# Patient Record
Sex: Male | Born: 1955 | Race: Asian | Hispanic: No | Marital: Married | Smoking: Never smoker
Health system: Southern US, Community
[De-identification: ages and names within clinical notes are randomized; demographics above are authoritative.]

## PROBLEM LIST (undated history)

## (undated) DIAGNOSIS — M7500 Adhesive capsulitis of unspecified shoulder: Secondary | ICD-10-CM

## (undated) DIAGNOSIS — I10 Essential (primary) hypertension: Secondary | ICD-10-CM

## (undated) HISTORY — DX: Adhesive capsulitis of unspecified shoulder: M75.00

## (undated) HISTORY — PX: UPPER GI ENDOSCOPY: SHX6162

## (undated) HISTORY — PX: CHOLECYSTECTOMY: SHX55

---

## 1986-04-04 HISTORY — PX: REPAIR OF PERFORATED ULCER: SHX6065

## 2007-04-29 ENCOUNTER — Emergency Department (HOSPITAL_COMMUNITY): Admission: EM | Admit: 2007-04-29 | Discharge: 2007-04-29 | Payer: Self-pay | Admitting: Family Medicine

## 2007-04-29 LAB — CONVERTED CEMR LAB
HCT: 41.2 %
Hemoglobin: 14.1 g/dL
MCHC: 34.2 g/dL
Platelets: 201 10*3/uL
RBC: 4.73 M/uL

## 2007-05-04 ENCOUNTER — Ambulatory Visit: Payer: Self-pay | Admitting: Nurse Practitioner

## 2007-05-04 DIAGNOSIS — R109 Unspecified abdominal pain: Secondary | ICD-10-CM | POA: Insufficient documentation

## 2007-05-04 DIAGNOSIS — K219 Gastro-esophageal reflux disease without esophagitis: Secondary | ICD-10-CM | POA: Insufficient documentation

## 2007-05-04 DIAGNOSIS — M545 Low back pain, unspecified: Secondary | ICD-10-CM | POA: Insufficient documentation

## 2007-05-04 DIAGNOSIS — E119 Type 2 diabetes mellitus without complications: Secondary | ICD-10-CM | POA: Insufficient documentation

## 2007-05-04 LAB — CONVERTED CEMR LAB
ALT: 45 units/L (ref 0–53)
AST: 40 units/L — ABNORMAL HIGH (ref 0–37)
Albumin: 4.3 g/dL (ref 3.5–5.2)
Basophils Absolute: 0 10*3/uL (ref 0.0–0.1)
Basophils Relative: 0 % (ref 0–1)
Blood Glucose, Fingerstick: 116
CO2: 23 meq/L (ref 19–32)
Calcium: 9 mg/dL (ref 8.4–10.5)
Glucose, Urine, Semiquant: 500
HCT: 41.9 % (ref 39.0–52.0)
Hgb A1c MFr Bld: 6.6 %
Ketones, urine, test strip: NEGATIVE
Monocytes Absolute: 0.8 10*3/uL (ref 0.1–1.0)
Neutrophils Relative %: 44 % (ref 43–77)
Platelets: 215 10*3/uL (ref 150–400)
Potassium: 3.8 meq/L (ref 3.5–5.3)
Protein, U semiquant: NEGATIVE
RBC: 4.91 M/uL (ref 4.22–5.81)
Sodium: 139 meq/L (ref 135–145)
Specific Gravity, Urine: 1.015
TSH: 1.633 microintl units/mL (ref 0.350–5.50)
Urobilinogen, UA: 0.2
WBC: 8.3 10*3/uL (ref 4.0–10.5)
pH: 7

## 2007-05-25 ENCOUNTER — Ambulatory Visit: Payer: Self-pay | Admitting: Nurse Practitioner

## 2007-05-25 DIAGNOSIS — K5900 Constipation, unspecified: Secondary | ICD-10-CM | POA: Insufficient documentation

## 2007-05-25 LAB — CONVERTED CEMR LAB
Blood Glucose, Fingerstick: 143
Ketones, urine, test strip: NEGATIVE
Protein, U semiquant: NEGATIVE
Urobilinogen, UA: 0.2
WBC Urine, dipstick: NEGATIVE
pH: 6.5

## 2007-05-29 ENCOUNTER — Ambulatory Visit: Payer: Self-pay | Admitting: Nurse Practitioner

## 2007-06-12 ENCOUNTER — Encounter (INDEPENDENT_AMBULATORY_CARE_PROVIDER_SITE_OTHER): Payer: Self-pay | Admitting: Nurse Practitioner

## 2007-06-12 ENCOUNTER — Ambulatory Visit: Payer: Self-pay | Admitting: Internal Medicine

## 2007-06-22 ENCOUNTER — Ambulatory Visit: Payer: Self-pay | Admitting: Nurse Practitioner

## 2007-06-22 DIAGNOSIS — L909 Atrophic disorder of skin, unspecified: Secondary | ICD-10-CM | POA: Insufficient documentation

## 2007-06-22 DIAGNOSIS — L919 Hypertrophic disorder of the skin, unspecified: Secondary | ICD-10-CM

## 2007-06-25 ENCOUNTER — Ambulatory Visit (HOSPITAL_COMMUNITY): Admission: RE | Admit: 2007-06-25 | Discharge: 2007-06-25 | Payer: Self-pay | Admitting: Nurse Practitioner

## 2007-06-25 DIAGNOSIS — E7800 Pure hypercholesterolemia, unspecified: Secondary | ICD-10-CM | POA: Insufficient documentation

## 2007-06-25 LAB — CONVERTED CEMR LAB
Cholesterol: 216 mg/dL — ABNORMAL HIGH (ref 0–200)
HDL: 47 mg/dL (ref >39)
LDL Cholesterol: 131 mg/dL — ABNORMAL HIGH (ref 0–99)
Total CHOL/HDL Ratio: 4.6
Triglycerides: 191 mg/dL — ABNORMAL HIGH (ref <150)

## 2007-07-24 ENCOUNTER — Ambulatory Visit: Payer: Self-pay | Admitting: Nurse Practitioner

## 2007-07-24 LAB — CONVERTED CEMR LAB
Bilirubin Urine: NEGATIVE
Blood Glucose, Fingerstick: 162
Blood in Urine, dipstick: NEGATIVE
Microalb, Ur: 0.33 mg/dL (ref 0.00–1.89)
Protein, U semiquant: NEGATIVE
Specific Gravity, Urine: <1.005
pH: 6.5

## 2007-07-25 ENCOUNTER — Encounter (INDEPENDENT_AMBULATORY_CARE_PROVIDER_SITE_OTHER): Payer: Self-pay | Admitting: Nurse Practitioner

## 2007-08-14 ENCOUNTER — Ambulatory Visit: Payer: Self-pay | Admitting: Nurse Practitioner

## 2007-08-24 ENCOUNTER — Encounter (INDEPENDENT_AMBULATORY_CARE_PROVIDER_SITE_OTHER): Payer: Self-pay | Admitting: Nurse Practitioner

## 2007-09-10 ENCOUNTER — Encounter (INDEPENDENT_AMBULATORY_CARE_PROVIDER_SITE_OTHER): Payer: Self-pay | Admitting: Nurse Practitioner

## 2007-09-11 ENCOUNTER — Ambulatory Visit: Payer: Self-pay | Admitting: Nurse Practitioner

## 2007-09-14 ENCOUNTER — Ambulatory Visit: Payer: Self-pay | Admitting: Nurse Practitioner

## 2007-09-14 LAB — CONVERTED CEMR LAB: Hgb A1c MFr Bld: 5.9 %

## 2007-09-18 ENCOUNTER — Encounter (INDEPENDENT_AMBULATORY_CARE_PROVIDER_SITE_OTHER): Payer: Self-pay | Admitting: Nurse Practitioner

## 2007-11-02 ENCOUNTER — Ambulatory Visit: Payer: Self-pay | Admitting: Nurse Practitioner

## 2007-11-02 DIAGNOSIS — I10 Essential (primary) hypertension: Secondary | ICD-10-CM | POA: Insufficient documentation

## 2007-12-07 ENCOUNTER — Ambulatory Visit: Payer: Self-pay | Admitting: Nurse Practitioner

## 2007-12-11 LAB — CONVERTED CEMR LAB
CO2: 27 meq/L (ref 19–32)
Calcium: 9.7 mg/dL (ref 8.4–10.5)
Chloride: 103 meq/L (ref 96–112)
Cholesterol: 191 mg/dL (ref 0–200)
HDL: 41 mg/dL (ref >39)
Helicobacter Pylori Antibody-IgG: <0.4
Sodium: 140 meq/L (ref 135–145)
Total Bilirubin: 0.5 mg/dL (ref 0.3–1.2)
Total CHOL/HDL Ratio: 4.7

## 2007-12-27 ENCOUNTER — Ambulatory Visit: Payer: Self-pay | Admitting: Nurse Practitioner

## 2007-12-27 LAB — CONVERTED CEMR LAB: Blood Glucose, Fingerstick: 108

## 2008-01-09 ENCOUNTER — Ambulatory Visit: Payer: Self-pay | Admitting: Nurse Practitioner

## 2008-01-25 ENCOUNTER — Ambulatory Visit: Payer: Self-pay | Admitting: Gastroenterology

## 2008-01-25 DIAGNOSIS — K3189 Other diseases of stomach and duodenum: Secondary | ICD-10-CM | POA: Insufficient documentation

## 2008-01-25 DIAGNOSIS — R1013 Epigastric pain: Secondary | ICD-10-CM

## 2008-02-08 ENCOUNTER — Emergency Department (HOSPITAL_COMMUNITY): Admission: EM | Admit: 2008-02-08 | Discharge: 2008-02-08 | Payer: Self-pay | Admitting: Emergency Medicine

## 2008-02-20 ENCOUNTER — Ambulatory Visit: Payer: Self-pay | Admitting: Gastroenterology

## 2008-02-26 ENCOUNTER — Ambulatory Visit: Payer: Self-pay | Admitting: Nurse Practitioner

## 2008-02-26 DIAGNOSIS — H1045 Other chronic allergic conjunctivitis: Secondary | ICD-10-CM | POA: Insufficient documentation

## 2008-02-26 LAB — CONVERTED CEMR LAB: LDL Goal: 100 mg/dL

## 2008-02-27 ENCOUNTER — Encounter: Payer: Self-pay | Admitting: Gastroenterology

## 2008-02-27 ENCOUNTER — Ambulatory Visit: Payer: Self-pay | Admitting: Gastroenterology

## 2008-03-03 ENCOUNTER — Encounter: Payer: Self-pay | Admitting: Gastroenterology

## 2008-03-31 ENCOUNTER — Telehealth (INDEPENDENT_AMBULATORY_CARE_PROVIDER_SITE_OTHER): Payer: Self-pay | Admitting: *Deleted

## 2008-04-18 ENCOUNTER — Ambulatory Visit: Payer: Self-pay | Admitting: Gastroenterology

## 2008-05-21 ENCOUNTER — Ambulatory Visit: Payer: Self-pay | Admitting: Nurse Practitioner

## 2008-05-21 LAB — CONVERTED CEMR LAB
Blood Glucose, Fingerstick: 119
Hgb A1c MFr Bld: 5.6 %

## 2008-06-17 ENCOUNTER — Ambulatory Visit: Payer: Self-pay | Admitting: Gastroenterology

## 2008-09-12 ENCOUNTER — Ambulatory Visit: Payer: Self-pay | Admitting: Nurse Practitioner

## 2008-09-12 DIAGNOSIS — L259 Unspecified contact dermatitis, unspecified cause: Secondary | ICD-10-CM | POA: Insufficient documentation

## 2008-09-12 LAB — CONVERTED CEMR LAB
Bilirubin Urine: NEGATIVE
Hgb A1c MFr Bld: 5.6 %
Protein, U semiquant: NEGATIVE
Specific Gravity, Urine: 1.02
Urobilinogen, UA: 0.2
pH: 6.5

## 2008-09-15 LAB — CONVERTED CEMR LAB
ALT: 25 units/L (ref 0–53)
AST: 24 units/L (ref 0–37)
Albumin: 4.4 g/dL (ref 3.5–5.2)
Alkaline Phosphatase: 46 units/L (ref 39–117)
BUN: 15 mg/dL (ref 6–23)
CO2: 25 meq/L (ref 19–32)
Calcium: 9.1 mg/dL (ref 8.4–10.5)
Chloride: 105 meq/L (ref 96–112)
Creatinine, Ser: 1.02 mg/dL (ref 0.40–1.50)
LDL Cholesterol: 82 mg/dL (ref 0–99)
Potassium: 4.4 meq/L (ref 3.5–5.3)
Total Protein: 7.8 g/dL (ref 6.0–8.3)

## 2008-10-10 ENCOUNTER — Ambulatory Visit: Payer: Self-pay | Admitting: Nurse Practitioner

## 2008-10-10 DIAGNOSIS — M722 Plantar fascial fibromatosis: Secondary | ICD-10-CM | POA: Insufficient documentation

## 2008-10-16 ENCOUNTER — Encounter (INDEPENDENT_AMBULATORY_CARE_PROVIDER_SITE_OTHER): Payer: Self-pay | Admitting: Nurse Practitioner

## 2008-11-04 ENCOUNTER — Ambulatory Visit: Payer: Self-pay | Admitting: Gastroenterology

## 2009-01-16 ENCOUNTER — Ambulatory Visit: Payer: Self-pay | Admitting: Nurse Practitioner

## 2009-01-16 LAB — CONVERTED CEMR LAB: Blood Glucose, Fingerstick: 85

## 2009-04-01 ENCOUNTER — Emergency Department (HOSPITAL_COMMUNITY): Admission: EM | Admit: 2009-04-01 | Discharge: 2009-04-01 | Payer: Self-pay | Admitting: Emergency Medicine

## 2009-05-11 ENCOUNTER — Ambulatory Visit: Payer: Self-pay | Admitting: Nurse Practitioner

## 2009-05-11 LAB — CONVERTED CEMR LAB
Glucose, Bld: 73 mg/dL
Hgb A1c MFr Bld: 5.7 %

## 2009-05-13 ENCOUNTER — Encounter (INDEPENDENT_AMBULATORY_CARE_PROVIDER_SITE_OTHER): Payer: Self-pay | Admitting: Nurse Practitioner

## 2009-05-13 LAB — CONVERTED CEMR LAB
Basophils Absolute: 0 10*3/uL (ref 0.0–0.1)
Basophils Relative: 0 % (ref 0–1)
CO2: 28 meq/L (ref 19–32)
Calcium: 9.9 mg/dL (ref 8.4–10.5)
Cholesterol: 205 mg/dL — ABNORMAL HIGH (ref 0–200)
HCT: 41.9 % (ref 39.0–52.0)
HDL: 39 mg/dL — ABNORMAL LOW (ref >39)
Hemoglobin: 14.3 g/dL (ref 13.0–17.0)
Lymphocytes Relative: 41 % (ref 12–46)
Monocytes Relative: 6 % (ref 3–12)
Neutro Abs: 4.1 10*3/uL (ref 1.7–7.7)
Neutrophils Relative %: 51 % (ref 43–77)
Platelets: 221 10*3/uL (ref 150–400)
RBC: 5.04 M/uL (ref 4.22–5.81)
TSH: 2.414 microintl units/mL (ref 0.350–4.500)
Total Bilirubin: 0.3 mg/dL (ref 0.3–1.2)
Total CHOL/HDL Ratio: 5.3
Total Protein: 7.9 g/dL (ref 6.0–8.3)
Triglycerides: 241 mg/dL — ABNORMAL HIGH (ref <150)
VLDL: 48 mg/dL — ABNORMAL HIGH (ref 0–40)

## 2009-06-25 ENCOUNTER — Ambulatory Visit: Payer: Self-pay | Admitting: Nurse Practitioner

## 2009-06-25 LAB — CONVERTED CEMR LAB: Blood Glucose, Fingerstick: 102

## 2009-06-26 ENCOUNTER — Encounter (INDEPENDENT_AMBULATORY_CARE_PROVIDER_SITE_OTHER): Payer: Self-pay | Admitting: Nurse Practitioner

## 2009-10-20 ENCOUNTER — Ambulatory Visit: Payer: Self-pay | Admitting: Nurse Practitioner

## 2009-10-20 LAB — CONVERTED CEMR LAB: Hgb A1c MFr Bld: 5.9 %

## 2010-01-14 ENCOUNTER — Ambulatory Visit: Payer: Self-pay | Admitting: Internal Medicine

## 2010-01-14 ENCOUNTER — Encounter (INDEPENDENT_AMBULATORY_CARE_PROVIDER_SITE_OTHER): Payer: Self-pay | Admitting: Nurse Practitioner

## 2010-01-14 LAB — CONVERTED CEMR LAB
Creatinine, Urine: 73.9 mg/dL
Glucose, Urine, Semiquant: NEGATIVE
Ketones, urine, test strip: NEGATIVE
Nitrite: NEGATIVE
Protein, U semiquant: NEGATIVE
Specific Gravity, Urine: 1.01
Urobilinogen, UA: 0.2

## 2010-02-19 ENCOUNTER — Encounter (INDEPENDENT_AMBULATORY_CARE_PROVIDER_SITE_OTHER): Payer: Self-pay | Admitting: Nurse Practitioner

## 2010-03-01 ENCOUNTER — Encounter (INDEPENDENT_AMBULATORY_CARE_PROVIDER_SITE_OTHER): Payer: Self-pay | Admitting: Nurse Practitioner

## 2010-03-26 ENCOUNTER — Encounter (INDEPENDENT_AMBULATORY_CARE_PROVIDER_SITE_OTHER): Payer: Self-pay | Admitting: Nurse Practitioner

## 2010-04-28 ENCOUNTER — Ambulatory Visit
Admission: RE | Admit: 2010-04-28 | Discharge: 2010-04-28 | Payer: Self-pay | Source: Home / Self Care | Attending: Nurse Practitioner | Admitting: Nurse Practitioner

## 2010-04-28 LAB — CONVERTED CEMR LAB: Hgb A1c MFr Bld: 5.6 %

## 2010-05-04 NOTE — Letter (Signed)
Summary: DIABETIC EYE EXAMINATION REPORT  DIABETIC EYE EXAMINATION REPORT   Imported By: Arta Bruce 03/01/2010 16:03:05  _____________________________________________________________________  External Attachment:    Type:   Image     Comment:   External Document

## 2010-05-04 NOTE — Assessment & Plan Note (Signed)
Summary: Diabetes/HTN   Vital Signs:  Patient profile:   55 year old male Weight:      134.2 pounds BMI:     25.45 BSA:     1.60 Temp:     98.5 degrees F oral Pulse rate:   76 / minute Pulse rhythm:   regular Resp:     20 per minute BP sitting:   106 / 75  (left arm) Cuff size:   regular  Vitals Entered By: Levon Hedger (June 25, 2009 11:43 AM) CC: follow-up visit DM....pt has not been taking DM medications for 6 weeks and has brought log of readings....back and stomach pain, Hypertension Management, Lipid Management, Abdominal Pain Is Patient Diabetic? Yes Pain Assessment Patient in pain? no      CBG Result 102 CBG Device ID A  Does patient need assistance? Functional Status Self care Ambulation Normal   Primary Care Provider:  Lehman Prom FNP  CC:  follow-up visit DM....pt has not been taking DM medications for 6 weeks and has brought log of readings....back and stomach pain, Hypertension Management, Lipid Management, and Abdominal Pain.  History of Present Illness:  Pt into the office for diabetes f/u. During the last visit pt was doing well with is blood sugar. Medications stopped during the last visit.  Pt reports today that he notices when his blood sugar gets above 150 that he has tingling in his body.   Daughter present to interpret today for pt  Diabetes Management History:      The patient is a 55 years old male who comes in for evaluation of Type 2 Diabetes Mellitus.  He has not been enrolled in the "Diabetic Education Program".  He states understanding of dietary principles and is following his diet appropriately.  Sensory loss is noted.  Self foot exams are being performed.  He is checking home blood sugars.  He says that he is not exercising regularly.        Other comments include: checks blood sugar every other day before breakfast.        There are no symptoms to suggest diabetic complications.  No changes have been made to his treatment plan  since last visit.        His home blood sugars include fasting blood sugars: highest: 178, lowest: 100.    Dyspepsia History:      There is a prior history of GERD.  The patient does not have a prior history of documented ulcer disease.  The dominant symptom is heartburn or acid reflux.  An H-2 blocker medication is not currently being taken.  He has no history of a positive H. Pylori serology.  A prior EGD has been done which showed moderate or severe esophagitis.    Hypertension History:      He denies headache, chest pain, and palpitations.  He notes no problems with any antihypertensive medication side effects.  pt is taking medication as ordered.        Positive major cardiovascular risk factors include male age 43 years old or older, diabetes, hyperlipidemia, and hypertension.  Negative major cardiovascular risk factors include negative family history for ischemic heart disease and non-tobacco-user status.        Further assessment for target organ damage reveals no history of ASHD, cardiac end-organ damage (CHF/LVH), stroke/TIA, peripheral vascular disease, renal insufficiency, or hypertensive retinopathy.    Lipid Management History:      Positive NCEP/ATP III risk factors include male age 51 years old or  older, diabetes, HDL cholesterol less than 40, and hypertension.  Negative NCEP/ATP III risk factors include no family history for ischemic heart disease, non-tobacco-user status, no ASHD (atherosclerotic heart disease), no prior stroke/TIA, no peripheral vascular disease, and no history of aortic aneurysm.        The patient states that he knows about the "Therapeutic Lifestyle Change" diet.  The patient does not know about adjunctive measures for cholesterol lowering.  He expresses no side effects from his lipid-lowering medication.  The patient denies any symptoms to suggest myopathy or liver disease.       Allergies: No Known Drug Allergies  Review of Systems CV:  Denies chest pain  or discomfort. Resp:  Denies cough. GI:  Complains of indigestion. Neuro:  burning in entire body when his blood sugar get high.  He has correlated that when blood sugar is slightly elevated and then he exercises then it gets better.Marland Kitchen  Physical Exam  General:  alert.   Head:  normocephalic.   Eyes:  glasses Lungs:  normal breath sounds.   Heart:  normal rate and regular rhythm.   Abdomen:  lumbar support in place Msk:  up to the exam table Neurologic:  alert & oriented X3.    Diabetes Management Exam:    Foot Exam (with socks and/or shoes not present):       Sensory-Monofilament:          Left foot: normal          Right foot: normal   Impression & Recommendations:  Problem # 1:  DIABETES MELLITUS, TYPE II (ICD-250.00) will restart glucophage 500mg   His updated medication list for this problem includes:    Glucophage Xr 500 Mg Xr24h-tab (Metformin hcl) .Marland Kitchen... 1/2 tablet by mouth daily    Lisinopril 5 Mg Tabs (Lisinopril) ..... One tablet by mouth daily for blood pressure  Orders: Capillary Blood Glucose/CBG (01027)  Problem # 2:  HYPERTENSION, BENIGN ESSENTIAL, CONTROLLED (ICD-401.1) DASH diet continue current meds His updated medication list for this problem includes:    Lisinopril 5 Mg Tabs (Lisinopril) ..... One tablet by mouth daily for blood pressure  Problem # 3:  HYPERCHOLESTEROLEMIA (ICD-272.0) will increase pravastatin to 40mg  as per last cholesterol check His updated medication list for this problem includes:    Pravastatin Sodium 40 Mg Tabs (Pravastatin sodium) ..... One tablet by mouth nightly for cholesterol **pharmacy - note change in dose**  Problem # 4:  GERD (ICD-530.81)  His updated medication list for this problem includes:    Omeprazole-sodium Bicarbonate 40-1100 Mg Caps (Omeprazole-sodium bicarbonate) ..... One tablet by mouth daily before breakfast for  Complete Medication List: 1)  Lumbar Support Cushion Misc (Misc. devices) .... Apply to  lumbar area of back daily dx - lumbago - 724.2 size to fit 2)  Prodigy Autocode Blood Glucose W/device Kit (Blood glucose monitoring suppl) .... Dispense glucometer to check blood sugar  dx 250.00 3)  Multivitamins Tabs (Multiple vitamin) .Marland Kitchen.. 1 tablet by mouth daily 4)  Glucophage Xr 500 Mg Xr24h-tab (Metformin hcl) .... 1/2 tablet by mouth daily 5)  Ultram 50 Mg Tabs (Tramadol hcl) .Marland Kitchen.. 1 tablet by mouth daily as needed for back 6)  Pravastatin Sodium 40 Mg Tabs (Pravastatin sodium) .... One tablet by mouth nightly for cholesterol **pharmacy - note change in dose** 7)  Omeprazole-sodium Bicarbonate 40-1100 Mg Caps (Omeprazole-sodium bicarbonate) .... One tablet by mouth daily before breakfast for 8)  Prodigy Twist Top Lancets 28g Misc (Lancets) .... To check blood  sugar daily before breakfast 9)  Prodigy Blood Glucose Test Strp (Glucose blood) .... To check blood sugar daily before breakfast 10)  Hydrocortisone 2.5 % Crea (Hydrocortisone) .... One application topically to neck two times a day 11)  Lisinopril 5 Mg Tabs (Lisinopril) .... One tablet by mouth daily for blood pressure 12)  Gas-x 80 Mg Chew (Simethicone) .... Take 1-2 pills with every meal  Diabetes Management Assessment/Plan:      The following lipid goals have been established for the patient: Total cholesterol goal of 200; LDL cholesterol goal of 100; HDL cholesterol goal of 40; Triglyceride goal of 150.  His blood pressure goal is < 130/80.    Dyspepsia Assessment/Plan:  Step Therapy: GERD Treatment Protocols:    Step-1: started  Hypertension Assessment/Plan:      The patient's hypertensive risk group is category C: Target organ damage and/or diabetes.  His calculated 10 year risk of coronary heart disease is 14 %.  Today's blood pressure is 106/75.  His blood pressure goal is < 130/80.  Lipid Assessment/Plan:      Based on NCEP/ATP III, the patient's risk factor category is "history of diabetes".  The patient's lipid goals  are as follows: Total cholesterol goal is 200; LDL cholesterol goal is 100; HDL cholesterol goal is 40; Triglyceride goal is 150.    Patient Instructions: 1)  Keep checking your blood sugar every other day. 2)  Restart blood sugar medications. 3)  Start new medication for your stomach. 4)  Follow up in 3 months - June 2011 or sooner if necessary Prescriptions: OMEPRAZOLE-SODIUM BICARBONATE 40-1100 MG CAPS (OMEPRAZOLE-SODIUM BICARBONATE) One tablet by mouth daily before breakfast for  #30 x 5   Entered and Authorized by:   Lehman Prom FNP   Signed by:   Lehman Prom FNP on 06/25/2009   Method used:   Print then Give to Patient   RxID:   [id number] GLUCOPHAGE XR 500 MG XR24H-TAB (METFORMIN HCL) 1/2 tablet by mouth daily  #15 x 5   Entered and Authorized by:   Lehman Prom FNP   Signed by:   Lehman Prom FNP on 06/25/2009   Method used:   Print then Give to Patient   RxID:   [id number] PRAVASTATIN SODIUM 40 MG TABS (PRAVASTATIN SODIUM) One tablet by mouth nightly for cholesterol **Pharmacy - note change in dose**  #30 x 5   Entered and Authorized by:   Lehman Prom FNP   Signed by:   Lehman Prom FNP on 06/25/2009   Method used:   Print then Give to Patient   RxID:   [id number] LISINOPRIL 5 MG TABS (LISINOPRIL) One tablet by mouth daily for blood pressure  #30 x 5   Entered and Authorized by:   Lehman Prom FNP   Signed by:   Lehman Prom FNP on 06/25/2009   Method used:   Print then Give to Patient   RxID:   [id number]   Last LDL:                                                 118 (05/11/2009 9:40:00 PM)        Diabetic Foot Exam    10-g (5.07) Semmes-Weinstein Monofilament Test Performed by: Levon Hedger          Right Foot  Left Foot Visual Inspection               Test Control      normal         normal Site 1         normal         normal Site 2         normal         normal Site 3         normal          normal Site 4         normal         normal Site 5         normal         normal Site 6         normal         normal Site 7         normal         normal Site 8         normal         normal Site 9         normal         normal Site 10         normal         normal  Impression      normal         normal

## 2010-05-04 NOTE — Miscellaneous (Signed)
Summary: Eye exam   Diabetes Management History:      The patient is a 55 years old male who comes in for evaluation of Type 2 Diabetes Mellitus.  He has not been enrolled in the "Diabetic Education Program".  He is checking home blood sugars.  He says that he is not exercising regularly.    Diabetes Management Exam:    Eye Exam:       Eye Exam done elsewhere          Date: 02/19/2010          Results: mild diabetic retinopathy          Done by: heather [place]  Diabetes Management Assessment/Plan:      The following lipid goals have been established for the patient: Total cholesterol goal of 200; LDL cholesterol goal of 100; HDL cholesterol goal of 40; Triglyceride goal of 150.  His blood pressure goal is < 130/80.   Clinical Lists Changes  Observations: Added new observation of EYES COMMENT: 03/2011 (03/01/2010 14:57) Added new observation of EYE EXAM BY: heather [place] (02/19/2010 14:58) Added new observation of DMEYEEXMRES: mild diabetic retinopathy (02/19/2010 14:58) Added new observation of DIAB EYE EX: mild diabetic retinopathy (02/19/2010 14:58)
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
## 2010-05-04 NOTE — Letter (Signed)
Summary: Lipid Letter  [facility]  [address] [place], [place] [address]   Phone: [phone]  Fax: [phone]    05/13/2009  [name] [address] [place], [place]  [address]  Dear Levi Ross:  We have carefully reviewed your last lipid profile from 05/11/2009 and the results are noted below with a summary of recommendations for lipid management.    Cholesterol:       205     Goal: less than 200   HDL "good" Cholesterol:   39     Goal: greater than 40   LDL "bad" Cholesterol:   118     Goal: less than 70   Triglycerides:       241     Goal: less than 150    Your cholesterol is still not at goal. Your medication has been increased to pravastatin 40mg  by mouth nightly.  A new prescription has been sent to your pharmacy.    Current Medications: 1)    Lumbar Support Cushion   Misc (Misc. devices) .... Apply to lumbar area of back daily dx - lumbago - 724.2 size to fit 2)    Prodigy Autocode Blood Glucose W/device Kit (Blood glucose monitoring suppl) .... Dispense glucometer to check blood sugar  dx 250.00 3)    Multivitamins  Tabs (Multiple vitamin) .Marland Kitchen.. 1 tablet by mouth daily 4)    Glucophage Xr 500 Mg Xr24h-tab (Metformin hcl) .... Hold 5)    Ultram 50 Mg Tabs (Tramadol hcl) .Marland Kitchen.. 1 tablet by mouth daily as needed for back 6)    Pravastatin Sodium 40 Mg Tabs (Pravastatin sodium) .... One tablet by mouth nightly for cholesterol **pharmacy - note change in dose** 7)    Nexium 40 Mg Cpdr (Esomeprazole magnesium) .Marland Kitchen.. 1 tablet by mouth daily for stomach pharmacy - prior approval authorized today** 8)    Prodigy Twist Top Lancets 28g  Misc (Lancets) .... To check blood sugar daily before breakfast 9)    Prodigy Blood Glucose Test  Strp (Glucose blood) .... To check blood sugar daily before breakfast 10)    Hydrocortisone 2.5 % Crea (Hydrocortisone) .... One application topically to neck two times a day 11)    Lisinopril 5 Mg Tabs (Lisinopril) .... One tablet by  mouth daily for blood pressure 12)    Gas-x 80 Mg Chew (Simethicone) .... Take 1-2 pills with every meal  If you have any questions, please call. We appreciate being able to work with you.   Sincerely,    [facility] Lehman Prom FNP

## 2010-05-04 NOTE — Assessment & Plan Note (Signed)
Summary: Diabetes   Vital Signs:  Patient profile:   55 year old male Height:      61 inches Weight:      134 pounds BMI:     25.41 Temp:     98.7 degrees F oral Pulse rate:   76 / minute Pulse rhythm:   regular Resp:     16 per minute BP sitting:   110 / 82  (left arm) Cuff size:   regular  Vitals Entered By: [place] Shannon (January 14, 2010 12:26 PM)  Nutrition Counseling: Patient's BMI is greater than 25 and therefore counseled on weight management options. CC: Hypertension Management, Lipid Management, Abdominal Pain CBG Result 71  Does patient need assistance? Functional Status Self care Ambulation Normal   Primary Care Provider:  Lehman Prom FNP  CC:  Hypertension Management, Lipid Management, and Abdominal Pain.  History of Present Illness:  Pt into the office for diabetes f/u. No acute problems at this time  Pt presents today with all his medications.  Dyspepsia History:      There is a prior history of GERD.  The patient does not have a prior history of documented ulcer disease.  The dominant symptom is heartburn or acid reflux.  An H-2 blocker medication is not currently being taken.  He has no history of a positive H. Pylori serology.  A prior EGD has been done which showed moderate or severe esophagitis.    Hypertension History:      He denies headache, chest pain, and palpitations.  He notes no problems with any antihypertensive medication side effects.        Positive major cardiovascular risk factors include male age 87 years old or older, diabetes, hyperlipidemia, and hypertension.  Negative major cardiovascular risk factors include negative family history for ischemic heart disease and non-tobacco-user status.        Further assessment for target organ damage reveals no history of ASHD, cardiac end-organ damage (CHF/LVH), stroke/TIA, peripheral vascular disease, renal insufficiency, or hypertensive retinopathy.    Lipid Management History:  Positive NCEP/ATP III risk factors include male age 47 years old or older, diabetes, HDL cholesterol less than 40, and hypertension.  Negative NCEP/ATP III risk factors include no family history for ischemic heart disease, non-tobacco-user status, no ASHD (atherosclerotic heart disease), no prior stroke/TIA, no peripheral vascular disease, and no history of aortic aneurysm.        The patient states that he knows about the "Therapeutic Lifestyle Change" diet.  The patient does not know about adjunctive measures for cholesterol lowering.  He expresses no side effects from his lipid-lowering medication.  The patient denies any symptoms to suggest myopathy or liver disease.       Habits & Providers  Alcohol-Tobacco-Diet     Alcohol drinks/day: 0     Tobacco Status: never  Exercise-Depression-Behavior     Does Patient Exercise: no     Depression Counseling: not indicated; screening negative for depression     Drug Use: no  Current Medications (verified): 1)  Lumbar Support Cushion   Misc (Misc. Devices) .... Apply To Lumbar Area of Back Daily Dx - Lumbago - 724.2 Size To Fit 2)  Prodigy Autocode Blood Glucose W/device Kit (Blood Glucose Monitoring Suppl) .... Dispense Glucometer To Check Blood Sugar  Dx 250.00 3)  Multivitamins  Tabs (Multiple Vitamin) .Marland Kitchen.. 1 Tablet By Mouth Daily 4)  Glucophage Xr 500 Mg Xr24h-Tab (Metformin Hcl) .... 1/2 Tablet By Mouth Daily 5)  Ultram 50 Mg  Tabs (Tramadol Hcl) .Marland Kitchen.. 1 Tablet By Mouth Daily As Needed For Back 6)  Pravastatin Sodium 40 Mg Tabs (Pravastatin Sodium) .... One Tablet By Mouth Nightly For Cholesterol **pharmacy - Note Change in Dose** 7)  Omeprazole-Sodium Bicarbonate 40-1100 Mg Caps (Omeprazole-Sodium Bicarbonate) .... One Tablet By Mouth Daily Before Breakfast For 8)  Prodigy Twist Top Lancets 28g  Misc (Lancets) .... To Check Blood Sugar Daily Before Breakfast 9)  Prodigy Blood Glucose Test  Strp (Glucose Blood) .... To Check Blood Sugar Daily  Before Breakfast 10)  Lisinopril 5 Mg Tabs (Lisinopril) .... One Tablet By Mouth Daily For Blood Pressure 11)  Gas-X 80 Mg Chew (Simethicone) .... Take 1-2 Pills With Every Meal 12)  Gabapentin 300 Mg Caps (Gabapentin) .... One Tablet By Mouth  Nightly For Back and Leg Pain  Allergies: No Known Drug Allergies  Review of Systems General:  Denies fever. CV:  Denies chest pain or discomfort. Resp:  Denies cough. GI:  Denies abdominal pain, nausea, and vomiting. MS:  Complains of low back pain.  Physical Exam  General:  alert.   Head:  normocephalic and male-pattern balding.   Lungs:  normal breath sounds.   Heart:  normal rate and regular rhythm.   Abdomen:  healed abdominal scar Msk:  normal ROM.   Neurologic:  alert & oriented X3.   Psych:  Oriented X3.     Impression & Recommendations:  Problem # 1:  DIABETES MELLITUS, TYPE II (ICD-250.00) Well controlled continue current medications His updated medication list for this problem includes:    Glucophage Xr 500 Mg Xr24h-tab (Metformin hcl) .Marland Kitchen... 1/2 tablet by mouth daily    Lisinopril 5 Mg Tabs (Lisinopril) ..... One tablet by mouth daily for blood pressure  Orders: Capillary Blood Glucose/CBG (29528) UA Dipstick w/o Micro (manual) (41324) T-Urine Microalbumin w/creat. ratio [phone])  Problem # 2:  HYPERTENSION, BENIGN ESSENTIAL, CONTROLLED (ICD-401.1) DASH diet continue current meds His updated medication list for this problem includes:    Lisinopril 5 Mg Tabs (Lisinopril) ..... One tablet by mouth daily for blood pressure  Orders: EKG w/ Interpretation (93000)  Problem # 3:  NEED PROPHYLACTIC VACCINATION&INOCULATION FLU (ICD-V04.81) given today in office Orders: Admin of Any Addtl Vaccine (34742)  Problem # 4:  HYPERCHOLESTEROLEMIA (ICD-272.0) stable His updated medication list for this problem includes:    Pravastatin Sodium 40 Mg Tabs (Pravastatin sodium) ..... One tablet by mouth nightly for  cholesterol **pharmacy - note change in dose**  Complete Medication List: 1)  Lumbar Support Cushion Misc (Misc. devices) .... Apply to lumbar area of back daily dx - lumbago - 724.2 size to fit 2)  Prodigy Autocode Blood Glucose W/device Kit (Blood glucose monitoring suppl) .... Dispense glucometer to check blood sugar  dx 250.00 3)  Multivitamins Tabs (Multiple vitamin) .Marland Kitchen.. 1 tablet by mouth daily 4)  Glucophage Xr 500 Mg Xr24h-tab (Metformin hcl) .... 1/2 tablet by mouth daily 5)  Ultram 50 Mg Tabs (Tramadol hcl) .Marland Kitchen.. 1 tablet by mouth daily as needed for back 6)  Pravastatin Sodium 40 Mg Tabs (Pravastatin sodium) .... One tablet by mouth nightly for cholesterol **pharmacy - note change in dose** 7)  Omeprazole-sodium Bicarbonate 40-1100 Mg Caps (Omeprazole-sodium bicarbonate) .... One tablet by mouth daily before breakfast for 8)  Prodigy Twist Top Lancets 28g Misc (Lancets) .... To check blood sugar daily before breakfast 9)  Prodigy Blood Glucose Test Strp (Glucose blood) .... To check blood sugar daily before breakfast 10)  Lisinopril 5 Mg Tabs (  Lisinopril) .... One tablet by mouth daily for blood pressure 11)  Gas-x 80 Mg Chew (Simethicone) .... Take 1-2 pills with every meal 12)  Gabapentin 300 Mg Caps (Gabapentin) .... One tablet by mouth  nightly for back and leg pain  Other Orders: Influenza Vaccine NON MCR ([id number])  Dyspepsia Assessment/Plan:  Step Therapy: GERD Treatment Protocols:    Step-1: started  Hypertension Assessment/Plan:      The patient's hypertensive risk group is category C: Target organ damage and/or diabetes.  His calculated 10 year risk of coronary heart disease is 14 %.  Today's blood pressure is 110/82.  His blood pressure goal is < 130/80.  Lipid Assessment/Plan:      Based on NCEP/ATP III, the patient's risk factor category is "history of diabetes".  The patient's lipid goals are as follows: Total cholesterol goal is 200; LDL cholesterol goal is 100; HDL  cholesterol goal is 40; Triglyceride goal is 150.    Patient Instructions: 1)  Flu vaccine given today. 2)  Blood sugar is doing well. 3)  Blood pressure is doing well. 4)  Follow up in this office in 4 months with n.martin,fnp for diabetes or sooner if necessary.  Will need hgba1c and tdap. Prescriptions: GABAPENTIN 300 MG CAPS (GABAPENTIN) One tablet by mouth  nightly for back and leg pain  #30 Each x 5   Entered and Authorized by:   Lehman Prom FNP   Signed by:   Lehman Prom FNP on 01/14/2010   Method used:   Print then Give to Patient   RxID:   [id number]   Laboratory Results   Urine Tests    Routine Urinalysis   Glucose: negative   (Normal Range: Negative) Bilirubin: negative   (Normal Range: Negative) Ketone: negative   (Normal Range: Negative) Spec. Gravity: 1.010   (Normal Range: 1.003-1.035) Blood: negative   (Normal Range: Negative) pH: 7.0   (Normal Range: 5.0-8.0) Protein: negative   (Normal Range: Negative) Urobilinogen: 0.2   (Normal Range: 0-1) Nitrite: negative   (Normal Range: Negative) Leukocyte Esterace: trace   (Normal Range: Negative)     Blood Tests     CBG Random:: 71mg /dL     Last LDL:                                                 118 (05/11/2009 9:40:00 PM)        Diabetic Foot Exam Last Podiatry Exam Date: 01/14/2010  Foot Inspection Is there a history of a foot ulcer?              No Is there a foot ulcer now?              No Is there swelling or an abnormal foot shape?          No Are the toenails long?                No Are the toenails thick?                No Are the toenails ingrown?              No Is there heavy callous build-up?              No Is there a claw toe deformity?  No Is there elevated skin temperature?            No Is there limited ankle dorsiflexion?            No Is there foot or ankle muscle weakness?            No Do you have pain in calf while walking?            No         Prevention & Chronic Care Immunizations   Influenza vaccine: Fluvax Non-MCR  (01/14/2010)    Tetanus booster: Not documented    Pneumococcal vaccine: Pneumovax  (07/24/2007)  Colorectal Screening   Hemoccult: Not documented    Colonoscopy: Location:  [facility].    (02/27/2008)   Colonoscopy due: 03/2018  Other Screening   PSA: 0.49  (05/11/2009)   Smoking status: never  (01/14/2010)  Diabetes Mellitus   HgbA1C: 5.9  (10/20/2009)   Hemoglobin A1C due: 12/15/2007    Eye exam: normal  (08/04/2008)    Foot exam: yes  (06/25/2009)   High risk foot: Not documented   Foot care education: Not documented   Foot exam due: 12/15/2007    Urine microalbumin/creatinine ratio: Not documented  Lipids   Total Cholesterol: 205  (05/11/2009)   LDL: 118  (05/11/2009)   LDL Direct: Not documented   HDL: 39  (05/11/2009)   Triglycerides: 241  (05/11/2009)    SGOT (AST): 23  (05/11/2009)   SGPT (ALT): 24  (05/11/2009)   Alkaline phosphatase: 42  (05/11/2009)   Total bilirubin: 0.3  (05/11/2009)  Hypertension   Last Blood Pressure: 110 / 82  (01/14/2010)   Serum creatinine: 1.00  (05/11/2009)   Serum potassium 4.7  (05/11/2009)  Self-Management Support :    Diabetes self-management support: Not documented    Hypertension self-management support: Not documented    Lipid self-management support: Not documented    Nursing Instructions: Give Flu vaccine today    Influenza Vaccine    Vaccine Type: Fluvax Non-MCR    Site: left deltoid    Mfr: GlaxoSmithKline    Dose: 0.5 ml    Route: IM    Given by: Michelle Nasuti    Exp. Date: 10/02/2010    Lot #: [id number]    VIS given: 10/27/09 version given January 14, 2010.  Flu Vaccine Consent Questions    Do you have a history of severe allergic reactions to this vaccine? no    Any prior history of allergic reactions to egg and/or gelatin? no    Do you have a sensitivity to the preservative  Thimersol? no    Do you have a past history of Guillan-Barre Syndrome? no    Do you currently have an acute febrile illness? no    Have you ever had a severe reaction to latex? no    Vaccine information given and explained to patient? yes

## 2010-05-04 NOTE — Assessment & Plan Note (Signed)
Summary: Diabetes/HTN   Vital Signs:  Patient profile:   55 year old male Weight:      133.2 pounds BMI:     25.26 BSA:     1.59 Temp:     97.8 degrees F oral Pulse rate:   75 / minute Pulse rhythm:   regular Resp:     20 per minute BP sitting:   127 / 82  (left arm) Cuff size:   regular  Vitals Entered By: Levon Hedger (May 11, 2009 3:23 PM) CC: pain in backbone x 12 days and stomach pain that is continuous...has been experiencing little drop when urinating, Lipid Management, Abdominal Pain, Hypertension Management Is Patient Diabetic? Yes Pain Assessment Patient in pain? yes     Location: back, stomach  Does patient need assistance? Functional Status Self care Ambulation Normal   Primary Care Provider:  Lehman Prom FNP  CC:  pain in backbone x 12 days and stomach pain that is continuous...has been experiencing little drop when urinating, Lipid Management, Abdominal Pain, and Hypertension Management.  History of Present Illness:  Pt into the office for diabetes f/u  Diabetes - presents today with blood sugar log Checks in the morning before food.   Pt presents today with all his medications.  Interpreter present during exam  Diabetes Management History:      The patient is a 55 years old male who comes in for evaluation of Type 2 Diabetes Mellitus.  He has not been enrolled in the "Diabetic Education Program".  He states understanding of dietary principles and is following his diet appropriately.  No sensory loss is reported.  Self foot exams are being performed.  He is checking home blood sugars.  He says that he is not exercising regularly.        Hypoglycemic symptoms are not occurring.  No hyperglycemic symptoms are reported.        No changes have been made to his treatment plan since last visit.        His home blood sugars include fasting blood sugars: highest: 152, lowest: 95.    Dyspepsia History:      There is a prior history of GERD.  The  patient does not have a prior history of documented ulcer disease.  The dominant symptom is heartburn or acid reflux.  An H-2 blocker medication is not currently being taken.  A prior EGD has been done which showed moderate or severe esophagitis.    Hypertension History:      He denies headache, chest pain, and palpitations.  He notes no problems with any antihypertensive medication side effects.  pt is taking well as needed.        Positive major cardiovascular risk factors include male age 54 years old or older, diabetes, hyperlipidemia, and hypertension.  Negative major cardiovascular risk factors include negative family history for ischemic heart disease and non-tobacco-user status.        Further assessment for target organ damage reveals no history of ASHD, cardiac end-organ damage (CHF/LVH), stroke/TIA, peripheral vascular disease, renal insufficiency, or hypertensive retinopathy.    Lipid Management History:      Positive NCEP/ATP III risk factors include male age 73 years old or older, diabetes, and hypertension.  Negative NCEP/ATP III risk factors include no family history for ischemic heart disease, non-tobacco-user status, no ASHD (atherosclerotic heart disease), no prior stroke/TIA, no peripheral vascular disease, and no history of aortic aneurysm.        The patient states  that he knows about the "Therapeutic Lifestyle Change" diet.  The patient does not know about adjunctive measures for cholesterol lowering.  He expresses no side effects from his lipid-lowering medication.  The patient denies any symptoms to suggest myopathy or liver disease.       Medications Prior to Update: 1)  Lumbar Support Cushion   Misc (Misc. Devices) .... Apply To Lumbar Area of Back Daily Dx - Lumbago - 724.2 Size To Fit 2)  Prodigy Autocode Blood Glucose W/device Kit (Blood Glucose Monitoring Suppl) .... Dispense Glucometer To Check Blood Sugar  Dx 250.00 3)  Multivitamins  Tabs (Multiple Vitamin) .Marland Kitchen.. 1  Tablet By Mouth Daily 4)  Glucophage Xr 500 Mg Xr24h-Tab (Metformin Hcl) .... 1/2  Tablet By Mouth Daily For Blood Sugar 5)  Ultram 50 Mg Tabs (Tramadol Hcl) .Marland Kitchen.. 1 Tablet By Mouth Daily As Needed For Back 6)  Pravachol 20 Mg Tabs (Pravastatin Sodium) .Marland Kitchen.. 1 Tablet By Mouth Nightly As Needed For Cholesterol 7)  Nexium 40 Mg Cpdr (Esomeprazole Magnesium) .Marland Kitchen.. 1 Tablet By Mouth Daily For Stomach Pharmacy - Prior Approval Authorized Today** 8)  Prodigy Twist Top Lancets 28g  Misc (Lancets) .... To Check Blood Sugar Daily Before Breakfast 9)  Prodigy Blood Glucose Test  Strp (Glucose Blood) .... To Check Blood Sugar Daily Before Breakfast 10)  Hydrocortisone 2.5 % Crea (Hydrocortisone) .... One Application Topically To Neck Two Times A Day 11)  Lisinopril 5 Mg Tabs (Lisinopril) .... One Tablet By Mouth Daily For Blood Pressure 12)  Gas-X 80 Mg Chew (Simethicone) .... Take 1-2 Pills With Every Meal 13)  Carafate 1 Gm Tabs (Sucralfate) .... One Tablet By Mouth 30 Minutes Before Breakfast, Lunch and Dinner  Allergies (verified): No Known Drug Allergies  Review of Systems CV:  Denies chest pain or discomfort. Resp:  Denies cough. GI:  Complains of abdominal pain; denies constipation, nausea, and vomiting; bloating, symptoms improved with gas - X but pt is not able to afford.  Physical Exam  General:  alert.   Head:  normocephalic.   Lungs:  normal breath sounds.   Heart:  normal rate and regular rhythm.   Abdomen:  normal bowel sounds.   Msk:  normal ROM.   Neurologic:  alert & oriented X3.   Psych:  Oriented X3.     Impression & Recommendations:  Problem # 1:  DIABETES MELLITUS, TYPE II (ICD-250.00) stable will stop glucophage and advise pt to keep checking daily. Bring log back into the office GI symptoms may improve with stop of glucophage His updated medication list for this problem includes:    Glucophage Xr 500 Mg Xr24h-tab (Metformin hcl) ..... Hold    Lisinopril 5 Mg Tabs  (Lisinopril) ..... One tablet by mouth daily for blood pressure  Problem # 2:  HYPERTENSION, BENIGN ESSENTIAL, CONTROLLED (ICD-401.1)  stable DASH diet His updated medication list for this problem includes:    Lisinopril 5 Mg Tabs (Lisinopril) ..... One tablet by mouth daily for blood pressure  Orders: T-Comprehensive Metabolic Panel [phone]) T-Lipid Profile ([id number])  Problem # 3:  HYPERCHOLESTEROLEMIA (ICD-272.0)  continue current meds His updated medication list for this problem includes:    Pravachol 20 Mg Tabs (Pravastatin sodium) .Marland Kitchen... 1 tablet by mouth nightly as needed for cholesterol  Orders: T-Lipid Profile ([id number])  Problem # 4:  GERD (ICD-530.81)  continue nexium pt with some bloating.  advised that he hold the glucophage and see if gi problems improve advoid gassy foods The following medications  were removed from the medication list:    Carafate 1 Gm Tabs (Sucralfate) ..... One tablet by mouth 30 minutes before breakfast, lunch and dinner His updated medication list for this problem includes:    Nexium 40 Mg Cpdr (Esomeprazole magnesium) .Marland Kitchen... 1 tablet by mouth daily for stomach pharmacy - prior approval authorized today**  Orders: T-CBC w/Diff ([id number]) T-TSH [phone])  Complete Medication List: 1)  Lumbar Support Cushion Misc (Misc. devices) .... Apply to lumbar area of back daily dx - lumbago - 724.2 size to fit 2)  Prodigy Autocode Blood Glucose W/device Kit (Blood glucose monitoring suppl) .... Dispense glucometer to check blood sugar  dx 250.00 3)  Multivitamins Tabs (Multiple vitamin) .Marland Kitchen.. 1 tablet by mouth daily 4)  Glucophage Xr 500 Mg Xr24h-tab (Metformin hcl) .... Hold 5)  Ultram 50 Mg Tabs (Tramadol hcl) .Marland Kitchen.. 1 tablet by mouth daily as needed for back 6)  Pravachol 20 Mg Tabs (Pravastatin sodium) .Marland Kitchen.. 1 tablet by mouth nightly as needed for cholesterol 7)  Nexium 40 Mg Cpdr (Esomeprazole magnesium) .Marland Kitchen.. 1 tablet by mouth daily  for stomach pharmacy - prior approval authorized today** 8)  Prodigy Twist Top Lancets 28g Misc (Lancets) .... To check blood sugar daily before breakfast 9)  Prodigy Blood Glucose Test Strp (Glucose blood) .... To check blood sugar daily before breakfast 10)  Hydrocortisone 2.5 % Crea (Hydrocortisone) .... One application topically to neck two times a day 11)  Lisinopril 5 Mg Tabs (Lisinopril) .... One tablet by mouth daily for blood pressure 12)  Gas-x 80 Mg Chew (Simethicone) .... Take 1-2 pills with every meal  Other Orders: T-PSA ([id number])  Diabetes Management Assessment/Plan:      The following lipid goals have been established for the patient: Total cholesterol goal of 200; LDL cholesterol goal of 100; HDL cholesterol goal of 40; Triglyceride goal of 150.  His blood pressure goal is < 130/80.    Dyspepsia Assessment/Plan:  Step Therapy: GERD Treatment Protocols:    Step-1: started  Hypertension Assessment/Plan:      The patient's hypertensive risk group is category C: Target organ damage and/or diabetes.  His calculated 10 year risk of coronary heart disease is 7 %.  Today's blood pressure is 127/82.  His blood pressure goal is < 130/80.  Lipid Assessment/Plan:      Based on NCEP/ATP III, the patient's risk factor category is "history of diabetes".  The patient's lipid goals are as follows: Total cholesterol goal is 200; LDL cholesterol goal is 100; HDL cholesterol goal is 40; Triglyceride goal is 150.    Patient Instructions: 1)  Follow up in this office in 6 weeks for diabetes 2)  will need to check blood sugar log. 3)  If ok no need to restart meds 4)  Check to see when last tetanus was given  Laboratory Results   Blood Tests   Date/Time Received: May 11, 2009 4:05 PM   Glucose (random): 73 mg/dL   (Normal Range: 57-846) HGBA1C: 5.7%   (Normal Range: Non-Diabetic - 3-6%   Control Diabetic - 6-8%)

## 2010-05-04 NOTE — Miscellaneous (Signed)
Summary: Prior approval   Clinical Lists Changes  Medications: Changed medication from OMEPRAZOLE-SODIUM BICARBONATE 40-1100 MG CAPS (OMEPRAZOLE-SODIUM BICARBONATE) One tablet by mouth daily before breakfast for to OMEPRAZOLE-SODIUM BICARBONATE 40-1100 MG CAPS (OMEPRAZOLE-SODIUM BICARBONATE) One tablet by mouth daily before breakfast for

## 2010-05-06 NOTE — Miscellaneous (Signed)
Summary: Refills   Clinical Lists Changes  Medications: Removed medication of PRODIGY TWIST TOP LANCETS 28G  MISC (LANCETS) to check blood sugar daily before breakfast Removed medication of PRODIGY BLOOD GLUCOSE TEST  STRP (GLUCOSE BLOOD) To check blood sugar daily before breakfast Removed medication of PRODIGY AUTOCODE BLOOD GLUCOSE W/DEVICE KIT (BLOOD GLUCOSE MONITORING SUPPL) dispense glucometer to check blood sugar  Dx 250.00 Added new medication of ACCU-CHEK AVIVA  KIT (BLOOD GLUCOSE MONITORING SUPPL) Use to check blood sugar daily - Signed Added new medication of ACCU-CHEK AVIVA  STRP (GLUCOSE BLOOD) Use to check blood sugar daily before breakfast - Signed Added new medication of ACCU-CHEK SOFT TOUCH LANCETS  MISC (LANCETS) Use to check blood sugar daily - Signed Rx of ACCU-CHEK AVIVA  KIT (BLOOD GLUCOSE MONITORING SUPPL) Use to check blood sugar daily;  #1 meter x 0;  Signed;  Entered by: Lehman Prom FNP;  Authorized by: Lehman Prom FNP;  Method used: Electronically to [facility]. [place]. [phone]*, [address], [place], [place], [place]  [address], Ph: [phone] or [phone], Fax: [phone] Rx of ACCU-CHEK AVIVA  STRP (GLUCOSE BLOOD) Use to check blood sugar daily before breakfast;  #50 x 5;  Signed;  Entered by: Lehman Prom FNP;  Authorized by: Lehman Prom FNP;  Method used: Electronically to [facility]. [place]. [phone]*, [address], [place], [place], [place]  [address], Ph: [phone] or [phone], Fax: [phone] Rx of ACCU-CHEK SOFT TOUCH LANCETS  MISC (LANCETS) Use to check blood sugar daily;  #50 x 5;  Signed;  Entered by: Lehman Prom FNP;  Authorized by: Lehman Prom FNP;  Method used: Electronically to [facility]. [place]. [phone]*, [address], [place], [place], [place]  [address], Ph: [phone] or [phone], Fax: [phone]    Prescriptions: ACCU-CHEK SOFT TOUCH LANCETS  MISC (LANCETS) Use to check blood sugar daily  #50 x  5   Entered and Authorized by:   Lehman Prom FNP   Signed by:   Lehman Prom FNP on 03/26/2010   Method used:   Electronically to        [facility] [address] [phone]* (retail)       [address]       [place], [place]  [address]       Ph: [phone] or [phone]       Fax: [phone]   RxID:   [id number] ACCU-CHEK AVIVA  STRP (GLUCOSE BLOOD) Use to check blood sugar daily before breakfast  #50 x 5   Entered and Authorized by:   Lehman Prom FNP   Signed by:   Lehman Prom FNP on 03/26/2010   Method used:   Electronically to        [facility] [address] [phone]* (retail)       [address]       [place], [place]  [address]       Ph: [phone] or [phone]       Fax: [phone]   RxID:   [id number] ACCU-CHEK AVIVA  KIT (BLOOD GLUCOSE MONITORING SUPPL) Use to check blood sugar daily  #1 meter x 0   Entered and Authorized by:   Lehman Prom FNP   Signed by:   Lehman Prom FNP on 03/26/2010   Method used:   Electronically to        [facility] [address] [phone]* (retail)       [address]       [place], [place]  [address]       Ph: [phone] or [phone]       Fax: [phone]   RxID:   [id number]

## 2010-05-06 NOTE — Assessment & Plan Note (Signed)
Summary: GERD   Vital Signs:  Patient profile:   55 year old male Weight:      138.1 pounds BMI:     26.19 Temp:     97.1 degrees F oral Pulse rate:   80 / minute Pulse rhythm:   regular Resp:     20 per minute BP sitting:   140 / 90  (left arm) Cuff size:   regular  Vitals Entered By: Levon Hedger (April 28, 2010 8:49 AM)  Nutrition Counseling: Patient's BMI is greater than 25 and therefore counseled on weight management options. CC: stomach and chest discomfort the past 7  days he has bee having alot of gas and unable to sleep...he thinks it is  when he uses medication , Abdominal Pain Is Patient Diabetic? Yes Pain Assessment Patient in pain? yes     Location: stomach CBG Result 131 CBG Device ID B  Does patient need assistance? Functional Status Self care Ambulation Normal  Immunization History:  Hepatitis B Immunization History:    Hepatitis B # 1:  historical (09/19/2007)    Hepatitis B # 2:  historical (11/21/2007)    Hepatitis B # 3:  historical (03/19/2008)  MMR Immunization History:    MMR # 1:  historical (05/23/2007)  Tetanus/Td Immunization History:    Tetanus/Td:  historical (11/21/2007)   Primary Care Provider:  Lehman Prom FNP  CC:  stomach and chest discomfort the past 7  days he has bee having alot of gas and unable to sleep...he thinks it is  when he uses medication  and Abdominal Pain.  History of Present Illness:  Pt into the office with complaints of stomach problems.  Pt presents today with all his medications.  Dyspepsia History:      He has no alarm features of dyspepsia including no history of melena, hematochezia, dysphagia, persistent vomiting, or involuntary weight loss > 5%.  There is a prior history of GERD.  The patient does not have a prior history of documented ulcer disease.  The dominant symptom is heartburn or acid reflux.  An H-2 blocker medication is not currently being taken.  A prior EGD has been done which  showed moderate or severe esophagitis.     Current Medications (verified): 1)  Lumbar Support Cushion   Misc (Misc. Devices) .... Apply To Lumbar Area of Back Daily Dx - Lumbago - 724.2 Size To Fit 2)  Multivitamins  Tabs (Multiple Vitamin) .Marland Kitchen.. 1 Tablet By Mouth Daily 3)  Glucophage Xr 500 Mg Xr24h-Tab (Metformin Hcl) .... 1/2 Tablet By Mouth Daily 4)  Ultram 50 Mg Tabs (Tramadol Hcl) .Marland Kitchen.. 1 Tablet By Mouth Daily As Needed For Back 5)  Pravastatin Sodium 40 Mg Tabs (Pravastatin Sodium) .... One Tablet By Mouth Nightly For Cholesterol **pharmacy - Note Change in Dose** 6)  Omeprazole-Sodium Bicarbonate 40-1100 Mg Caps (Omeprazole-Sodium Bicarbonate) .... One Tablet By Mouth Daily Before Breakfast For 7)  Lisinopril 5 Mg Tabs (Lisinopril) .... One Tablet By Mouth Daily For Blood Pressure 8)  Gas-X 80 Mg Chew (Simethicone) .... Take 1-2 Pills With Every Meal 9)  Gabapentin 300 Mg Caps (Gabapentin) .... One Tablet By Mouth  Nightly For Back and Leg Pain 10)  Accu-Chek Aviva  Kit (Blood Glucose Monitoring Suppl) .... Use To Check Blood Sugar Daily 11)  Accu-Chek Aviva  Strp (Glucose Blood) .... Use To Check Blood Sugar Daily Before Breakfast 12)  Accu-Chek Soft Touch Lancets  Misc (Lancets) .... Use To Check Blood Sugar Daily  Allergies (  verified): No Known Drug Allergies  Review of Systems CV:  Denies chest pain or discomfort. Resp:  Denies cough. GI:  Complains of gas and indigestion.  Physical Exam  General:  alert.   Head:  normocephalic.   Eyes:  glasses Lungs:  normal breath sounds.   Heart:  normal rate and regular rhythm.     Impression & Recommendations:  Problem # 1:  GERD (ICD-530.81) will change to dexilant as symptoms are not improved The following medications were removed from the medication list:    Omeprazole-sodium Bicarbonate 40-1100 Mg Caps (Omeprazole-sodium bicarbonate) ..... One tablet by mouth daily before breakfast for His updated medication list for this  problem includes:    Dexilant 60 Mg Cpdr (Dexlansoprazole) ..... One tablet by mouth before breakfast  Problem # 2:  DIABETES MELLITUS, TYPE II (ICD-250.00) symptoms improved His updated medication list for this problem includes:    Glucophage Xr 500 Mg Xr24h-tab (Metformin hcl) .Marland Kitchen... 1/2 tablet by mouth daily    Lisinopril 5 Mg Tabs (Lisinopril) ..... One tablet by mouth daily for blood pressure  Orders: Capillary Blood Glucose/CBG (82948) Hemoglobin A1C (83036)  Complete Medication List: 1)  Lumbar Support Cushion Misc (Misc. devices) .... Apply to lumbar area of back daily dx - lumbago - 724.2 size to fit 2)  Multivitamins Tabs (Multiple vitamin) .Marland Kitchen.. 1 tablet by mouth daily 3)  Glucophage Xr 500 Mg Xr24h-tab (Metformin hcl) .... 1/2 tablet by mouth daily 4)  Ultram 50 Mg Tabs (Tramadol hcl) .Marland Kitchen.. 1 tablet by mouth daily as needed for back 5)  Pravastatin Sodium 40 Mg Tabs (Pravastatin sodium) .... One tablet by mouth nightly for cholesterol **pharmacy - note change in dose** 6)  Lisinopril 5 Mg Tabs (Lisinopril) .... One tablet by mouth daily for blood pressure 7)  Gas-x 80 Mg Chew (Simethicone) .... Take 1-2 pills with every meal 8)  Gabapentin 300 Mg Caps (Gabapentin) .... One tablet by mouth  nightly for back and leg pain 9)  Accu-chek Aviva Kit (Blood glucose monitoring suppl) .... Use to check blood sugar daily 10)  Accu-chek Aviva Strp (Glucose blood) .... Use to check blood sugar daily before breakfast 11)  Accu-chek Soft Touch Lancets Misc (Lancets) .... Use to check blood sugar daily 12)  Dexilant 60 Mg Cpdr (Dexlansoprazole) .... One tablet by mouth before breakfast  Dyspepsia Assessment/Plan:  Step Therapy: GERD Treatment Protocols:    Step-1: started  Patient Instructions: 1)  Diabetes - You have been given a prescription for a new blood sugar meter.  You should use this with your accu-chek test strips daily. 2)  Stomach - avoid foods that you know irritate your  stomach. 3)  Start the new medication dexilant 60mg  by mouth daily before breakfast 4)  Keep taking all your medications as ordered 5)  Follow up in 2 months for stomach and diabetes. Prescriptions: DEXILANT 60 MG CPDR (DEXLANSOPRAZOLE) One tablet by mouth before breakfast  #30 x 11   Entered and Authorized by:   Lehman Prom FNP   Signed by:   Lehman Prom FNP on 04/28/2010   Method used:   Print then Give to Patient   RxID:   [id number] ACCU-CHEK AVIVA  KIT (BLOOD GLUCOSE MONITORING SUPPL) Use to check blood sugar daily  #1 meter x 0   Entered and Authorized by:   Lehman Prom FNP   Signed by:   Lehman Prom FNP on 04/28/2010   Method used:   Print then Give to Patient  RxID:   [id number]   Diabetic Foot Exam Last Podiatry Exam Date: 01/14/2010 Foot Inspection Is there a history of a foot ulcer?              No Is there a foot ulcer now?              No Can the patient see the bottom of their feet?          Yes Are the shoes appropriate in style and fit?          Yes Is there swelling or an abnormal foot shape?          No Are the toenails long?                No Are the toenails thick?                No Are the toenails ingrown?              No Is there heavy callous build-up?              No Is there pain in the calf muscle (Intermittent claudication) when walking?    NoIs there a claw toe deformity?              No Is there elevated skin temperature?            No Is there limited ankle dorsiflexion?            No Is there foot or ankle muscle weakness?            No  Diabetic Foot Care Education Patient educated on appropriate care of diabetic feet.  Pulse Check          Right Foot          Left Foot Dorsalis Pedis:        diminished            normal    Orders Added: 1)  Capillary Blood Glucose/CBG [82948] 2)  Est. Patient Level III [78469] 3)  Hemoglobin A1C [83036]   Immunization History:  Hepatitis B Immunization History:    Hepatitis  B # 1:  historical (09/19/2007)    Hepatitis B # 2:  historical (11/21/2007)    Hepatitis B # 3:  historical (03/19/2008)  MMR Immunization History:    MMR # 1:  historical (05/23/2007)  Tetanus/Td Immunization History:    Tetanus/Td:  historical (11/21/2007)   Immunization History:  Hepatitis B Immunization History:    Hepatitis B # 1:  Historical (09/19/2007)    Hepatitis B # 2:  Historical (11/21/2007)    Hepatitis B # 3:  Historical (03/19/2008)  MMR Immunization History:    MMR # 1:  Historical (05/23/2007)  Tetanus/Td Immunization History:    Tetanus/Td:  Historical (11/21/2007)  Laboratory Results   Blood Tests   Date/Time Received: April 28, 2010 9:25 AM   HGBA1C: 5.6%   (Normal Range: Non-Diabetic - 3-6%   Control Diabetic - 6-8%) CBG Random:: 131mg /dL  Date/Time Received: April 28, 2010 9:24 AM

## 2010-05-13 ENCOUNTER — Encounter (INDEPENDENT_AMBULATORY_CARE_PROVIDER_SITE_OTHER): Payer: Self-pay | Admitting: Nurse Practitioner

## 2010-05-18 ENCOUNTER — Encounter (INDEPENDENT_AMBULATORY_CARE_PROVIDER_SITE_OTHER): Payer: Self-pay | Admitting: Nurse Practitioner

## 2010-05-21 ENCOUNTER — Encounter (INDEPENDENT_AMBULATORY_CARE_PROVIDER_SITE_OTHER): Payer: Self-pay | Admitting: Nurse Practitioner

## 2010-05-26 NOTE — Letter (Addendum)
Summary: Generic Letter  [facility]  [address] [place], [place] [address]   Phone: [phone]  Fax: [phone]        05/21/2010  Despina Pole [address] [place], [place]  [address]  Dear Mr. BARTLESON,  We have been unable to contact you by telephone.  Please call our office, at your earliest convenience, so that we may speak with you.  Sincerely,   Dutch Quint RN  Appended Document: Generic Letter Pt. in office with letter -- advised of approval of Dexilant.  Dutch Quint RN, May 31, 2010  4:30 PM

## 2010-05-26 NOTE — Miscellaneous (Signed)
Summary: PA approval for Dexilant x1 year 05/13/10 - 05/13/11  Clinical Lists Changes  PA approval for Dexilant x1 year 05/13/10 - 05/13/11  Pharmacy notified.  Left message with male for pt. to return call.  Dutch Quint RN  May 18, 2010 9:18 AM  Left message on voicemail for pt. to return call.  Dutch Quint RN  May 19, 2010 10:42 AM  Left message on voicemail for pt. to return call.  Dutch Quint RN  May 20, 2010 12:39 PM  Letter sent.  Dutch Quint RN  May 21, 2010 12:21 PM   Medications: Changed medication from DEXILANT 60 MG CPDR (DEXLANSOPRAZOLE) One tablet by mouth before breakfast to DEXILANT 60 MG CPDR (DEXLANSOPRAZOLE) One tablet by mouth before breakfast

## 2010-05-26 NOTE — Medication Information (Signed)
Summary: RX Folder//DEXILANT//APPROVED  RX Folder//DEXILANT//APPROVED   Imported By: Arta Bruce 05/18/2010 10:39:48  _____________________________________________________________________  External Attachment:    Type:   Image     Comment:   External Document

## 2010-06-21 ENCOUNTER — Encounter (INDEPENDENT_AMBULATORY_CARE_PROVIDER_SITE_OTHER): Payer: Self-pay | Admitting: Nurse Practitioner

## 2010-06-21 ENCOUNTER — Encounter: Payer: Self-pay | Admitting: Nurse Practitioner

## 2010-06-21 DIAGNOSIS — M25579 Pain in unspecified ankle and joints of unspecified foot: Secondary | ICD-10-CM | POA: Insufficient documentation

## 2010-07-01 NOTE — Assessment & Plan Note (Signed)
Summary: Diabetes   Vital Signs:  Patient profile:   55 year old male Weight:      133.38 pounds Temp:     98.6 degrees F oral Pulse rate:   83 / minute Pulse rhythm:   regular Resp:     18 per minute BP sitting:   116 / 76  (left arm) Cuff size:   regular  Vitals Entered By: Hale Drone CMA (June 21, 2010 3:28 PM) CC: f/u on medication for stomach... Feels better and is now able to sleep at night... Having bilateral ankle pain x1 week... Hurts when he walks. , Abdominal Pain, Hypertension Management, Lipid Management Is Patient Diabetic? Yes Pain Assessment Patient in pain? yes      CBG Result 173 CBG Device ID M non fasting  Does patient need assistance? Functional Status Self care Ambulation Normal   Primary Care Provider:  Lehman Prom FNP  CC:  f/u on medication for stomach... Feels better and is now able to sleep at night... Having bilateral ankle pain x1 week... Hurts when he walks. , Abdominal Pain, Hypertension Management, and Lipid Management.  History of Present Illness:  Pt into the office for routine diabetes but reports that he is having some problems with his ankles Bil ankle pain started 1 week ago No swelling Denies any trauma Pain present when he wakes in the morning but when he starts his daily exercise it gets some better.   Pt presents today with all his medication of which he is very well familar  Diabetes Management History:      The patient is a 55 years old male who comes in for evaluation of Type 2 Diabetes Mellitus.  He has not been enrolled in the "Diabetic Education Program".  He states lack of understanding of dietary principles and is not following his diet appropriately.  No sensory loss is reported.  Self foot exams are being performed.  He is checking home blood sugars.  He says that he is not exercising regularly.        Hypoglycemic symptoms are not occurring.  No hyperglycemic symptoms are reported.  Other comments include: Pt is  taking medications as ordered. he needs refills on metformin.        No changes have been made to his treatment plan since last visit.    Dyspepsia History:      He has no alarm features of dyspepsia including no history of melena, hematochezia, dysphagia, persistent vomiting, or involuntary weight loss > 5%.  There is a prior history of GERD.  The patient does not have a prior history of documented ulcer disease.  The dominant symptom is not heartburn or acid reflux.  An H-2 blocker medication is not currently being taken.  A prior EGD has been done which showed moderate or severe esophagitis.    Hypertension History:      He denies headache, chest pain, and palpitations.  He notes no problems with any antihypertensive medication side effects.        Positive major cardiovascular risk factors include male age 51 years old or older, diabetes, hyperlipidemia, and hypertension.  Negative major cardiovascular risk factors include negative family history for ischemic heart disease and non-tobacco-user status.        Further assessment for target organ damage reveals no history of ASHD, cardiac end-organ damage (CHF/LVH), stroke/TIA, peripheral vascular disease, renal insufficiency, or hypertensive retinopathy.    Lipid Management History:      Positive NCEP/ATP III risk  factors include male age 18 years old or older, diabetes, HDL cholesterol less than 40, and hypertension.  Negative NCEP/ATP III risk factors include no family history for ischemic heart disease, non-tobacco-user status, no ASHD (atherosclerotic heart disease), no prior stroke/TIA, no peripheral vascular disease, and no history of aortic aneurysm.        The patient states that he knows about the "Therapeutic Lifestyle Change" diet.  The patient does not know about adjunctive measures for cholesterol lowering.  He expresses no side effects from his lipid-lowering medication.  The patient denies any symptoms to suggest myopathy or liver  disease.       Allergies: No Known Drug Allergies  Review of Systems General:  Denies fever. CV:  Denies chest pain or discomfort. Resp:  Denies cough. GI:  Denies abdominal pain, indigestion, nausea, and vomiting; symptoms improved since he has been on dexilant.  Physical Exam  General:  alert.   Head:  normocephalic.   Lungs:  normal breath sounds.   Heart:  normal rate and regular rhythm.   Abdomen:  normal bowel sounds.   Neurologic:  alert & oriented X3.   Skin:  color normal.    Diabetes Management Exam:    Foot Exam (with socks and/or shoes not present):       Sensory-Monofilament:          Left foot: normal          Right foot: normal  Foot/Ankle Exam  Ankle Exam:    Right:    Inspection:  Normal    Palpation:  Normal    Stability:  stable    Tenderness:  yes    Swelling:  no    Erythema:  yes    Left:    Inspection:  Normal    Palpation:  Normal    Stability:  stable    Tenderness:  yes    Swelling:  no    Erythema:  no    Impression & Recommendations:  Problem # 1:  ANKLE PAIN, BILATERAL (ICD-719.47) ace wrap placed to bilateral ankles  advised pt to wear daily for symptoms Orders: Ace  Bandage < 3in. (Z6109) Ace  Bandage < 3in. [phone])  Problem # 2:  DIABETES MELLITUS, TYPE II (ICD-250.00) good control. continue current meds His updated medication list for this problem includes:    Glucophage Xr 500 Mg Xr24h-tab (Metformin hcl) .Marland Kitchen... 1/2 tablet by mouth daily    Lisinopril 5 Mg Tabs (Lisinopril) ..... One tablet by mouth daily for blood pressure  Orders: Capillary Blood Glucose/CBG (09811)  Problem # 3:  HYPERTENSION, BENIGN ESSENTIAL, CONTROLLED (ICD-401.1) BP is stable DASH diet His updated medication list for this problem includes:    Lisinopril 5 Mg Tabs (Lisinopril) ..... One tablet by mouth daily for blood pressure  Problem # 4:  HYPERCHOLESTEROLEMIA (ICD-272.0) will need to check labs on next lab visit His updated  medication list for this problem includes:    Pravastatin Sodium 40 Mg Tabs (Pravastatin sodium) ..... One tablet by mouth nightly for cholesterol **pharmacy - note change in dose**  Complete Medication List: 1)  Lumbar Support Cushion Misc (Misc. devices) .... Apply to lumbar area of back daily dx - lumbago - 724.2 size to fit 2)  Multivitamins Tabs (Multiple vitamin) .Marland Kitchen.. 1 tablet by mouth daily 3)  Glucophage Xr 500 Mg Xr24h-tab (Metformin hcl) .... 1/2 tablet by mouth daily 4)  Ultram 50 Mg Tabs (Tramadol hcl) .Marland Kitchen.. 1 tablet by mouth daily as needed for  back 5)  Pravastatin Sodium 40 Mg Tabs (Pravastatin sodium) .... One tablet by mouth nightly for cholesterol **pharmacy - note change in dose** 6)  Lisinopril 5 Mg Tabs (Lisinopril) .... One tablet by mouth daily for blood pressure 7)  Gas-x 80 Mg Chew (Simethicone) .... Take 1-2 pills with every meal 8)  Gabapentin 300 Mg Caps (Gabapentin) .... One tablet by mouth  nightly for back and leg pain 9)  Accu-chek Aviva Kit (Blood glucose monitoring suppl) .... Use to check blood sugar daily 10)  Accu-chek Aviva Strp (Glucose blood) .... Use to check blood sugar daily before breakfast 11)  Accu-chek Soft Touch Lancets Misc (Lancets) .... Use to check blood sugar daily 12)  Dexilant 60 Mg Cpdr (Dexlansoprazole) .... One tablet by mouth before breakfast  Diabetes Management Assessment/Plan:      The following lipid goals have been established for the patient: Total cholesterol goal of 200; LDL cholesterol goal of 100; HDL cholesterol goal of 40; Triglyceride goal of 150.  His blood pressure goal is < 130/80.    Dyspepsia Assessment/Plan:  Step Therapy: GERD Treatment Protocols:    Step-1: started  Hypertension Assessment/Plan:      The patient's hypertensive risk group is category C: Target organ damage and/or diabetes.  His calculated 10 year risk of coronary heart disease is 18 %.  Today's blood pressure is 116/76.  His blood pressure goal is  < 130/80.  Lipid Assessment/Plan:      Based on NCEP/ATP III, the patient's risk factor category is "history of diabetes".  The patient's lipid goals are as follows: Total cholesterol goal is 200; LDL cholesterol goal is 100; HDL cholesterol goal is 40; Triglyceride goal is 150.    Patient Instructions: 1)  Schedule a lab visit for fasting labs - lipids (272.0), CMET (401.1) and cbc (530.80) 2)  Bilateral ankle pain - use the ace wraps and apply daily. 3)  Call for a follow up appointment in pain in the ankles conitnue otherwise 4)  follow up in 3 months for diabetes and high blood pressure Prescriptions: GLUCOPHAGE XR 500 MG XR24H-TAB (METFORMIN HCL) 1/2 tablet by mouth daily  #15 Each x 11   Entered and Authorized by:   Lehman Prom FNP   Signed by:   Lehman Prom FNP on 06/21/2010   Method used:   Electronically to        [facility] [address] [phone]* (retail)       [address]       [place], [place]  [address]       Ph: [phone] or [phone]       Fax: [phone]   RxID:   [id number]   Diabetic Foot Exam Last Podiatry Exam Date: 01/14/2010 Foot Inspection Is there a history of a foot ulcer?              No Is there a foot ulcer now?              No Can the patient see the bottom of their feet?          Yes Are the shoes appropriate in style and fit?          Yes Is there swelling or an abnormal foot shape?          No Are the toenails long?                No Are  the toenails thick?                No Are the toenails ingrown?              No Is there heavy callous build-up?              No Is there pain in the calf muscle (Intermittent claudication) when walking?    NoIs there a claw toe deformity?              Yes Is there elevated skin temperature?            No Is there limited ankle dorsiflexion?            No Is there foot or ankle muscle weakness?            No  Diabetic Foot Care Education Patient educated on appropriate care of  diabetic feet.  Pulse Check          Right Foot          Left Foot Dorsalis Pedis:        normal            normal    10-g (5.07) Semmes-Weinstein Monofilament Test Performed by: Hale Drone CMA          Right Foot          Left Foot Visual Inspection     normal         normal Test Control      normal         normal Site 1         normal         normal Site 2         normal         normal Site 3         normal         normal Site 4         normal         normal Site 5         normal         normal Site 6         normal         normal Site 7         normal         normal Site 8         normal         normal Site 9         abnormal         abnormal Site 10         normal         normal  Impression      normal         normal   Orders Added: 1)  Capillary Blood Glucose/CBG [82948] 2)  Est. Patient Level IV [16109] 3)  Ace  Bandage < 3in. [U0454] 4)  Ace  Bandage < 3in. [U9811]     Diabetic Foot Exam Last Podiatry Exam Date: 01/14/2010 Foot Inspection Is there a history of a foot ulcer?              No Is there a foot ulcer now?              No Can the patient see the bottom of their feet?          Yes Are the shoes  appropriate in style and fit?          Yes Is there swelling or an abnormal foot shape?          No Are the toenails long?                No Are the toenails thick?                No Are the toenails ingrown?              No Is there heavy callous build-up?              No Is there pain in the calf muscle (Intermittent claudication) when walking?    NoIs there a claw toe deformity?              Yes Is there elevated skin temperature?            No Is there limited ankle dorsiflexion?            No Is there foot or ankle muscle weakness?            No  Diabetic Foot Care Education Patient educated on appropriate care of diabetic feet.  Pulse Check          Right Foot          Left Foot Dorsalis Pedis:        normal            normal    10-g (5.07)  Semmes-Weinstein Monofilament Test Performed by: Hale Drone CMA          Right Foot          Left Foot Visual Inspection     normal         normal Test Control      normal         normal Site 1         normal         normal Site 2         normal         normal Site 3         normal         normal Site 4         normal         normal Site 5         normal         normal Site 6         normal         normal Site 7         normal         normal Site 8         normal         normal Site 9         abnormal         abnormal Site 10         normal         normal  Impression      normal         normal

## 2010-07-02 ENCOUNTER — Encounter (INDEPENDENT_AMBULATORY_CARE_PROVIDER_SITE_OTHER): Payer: Self-pay | Admitting: Nurse Practitioner

## 2010-07-02 LAB — CONVERTED CEMR LAB
ALT: 26 units/L (ref 0–53)
AST: 27 units/L (ref 0–37)
Albumin: 4.6 g/dL (ref 3.5–5.2)
Alkaline Phosphatase: 43 units/L (ref 39–117)
BUN: 12 mg/dL (ref 6–23)
Basophils Absolute: 0 10*3/uL (ref 0.0–0.1)
Basophils Relative: 0 % (ref 0–1)
Cholesterol: 176 mg/dL (ref 0–200)
Creatinine, Ser: 1.01 mg/dL (ref 0.40–1.50)
Eosinophils Absolute: 0.2 10*3/uL (ref 0.0–0.7)
Eosinophils Relative: 3 % (ref 0–5)
Glucose, Bld: 107 mg/dL — ABNORMAL HIGH (ref 70–99)
HCT: 41.6 % (ref 39.0–52.0)
MCV: 86 fL (ref 78.0–100.0)
Monocytes Absolute: 0.3 10*3/uL (ref 0.1–1.0)
Neutrophils Relative %: 47 % (ref 43–77)
RDW: 13.2 % (ref 11.5–15.5)
Sodium: 141 meq/L (ref 135–145)
Total Bilirubin: 0.7 mg/dL (ref 0.3–1.2)
Total Protein: 7.4 g/dL (ref 6.0–8.3)
Triglycerides: 176 mg/dL — ABNORMAL HIGH (ref <150)
VLDL: 35 mg/dL (ref 0–40)

## 2010-07-05 LAB — POCT RAPID STREP A (OFFICE): Streptococcus, Group A Screen (Direct): NEGATIVE

## 2010-10-11 ENCOUNTER — Ambulatory Visit: Payer: Medicaid Other | Attending: Family Medicine | Admitting: Physical Therapy

## 2010-10-11 DIAGNOSIS — IMO0001 Reserved for inherently not codable concepts without codable children: Secondary | ICD-10-CM | POA: Insufficient documentation

## 2010-10-11 DIAGNOSIS — M2550 Pain in unspecified joint: Secondary | ICD-10-CM | POA: Insufficient documentation

## 2010-10-11 DIAGNOSIS — M6281 Muscle weakness (generalized): Secondary | ICD-10-CM | POA: Insufficient documentation

## 2010-10-11 DIAGNOSIS — M2560 Stiffness of unspecified joint, not elsewhere classified: Secondary | ICD-10-CM | POA: Insufficient documentation

## 2010-10-12 ENCOUNTER — Emergency Department (HOSPITAL_COMMUNITY): Payer: Medicaid Other

## 2010-10-12 ENCOUNTER — Emergency Department (HOSPITAL_COMMUNITY)
Admission: EM | Admit: 2010-10-12 | Discharge: 2010-10-12 | Disposition: A | Discharge status: Home / Self Care | Payer: Medicaid Other | Attending: Emergency Medicine | Admitting: Emergency Medicine

## 2010-10-12 DIAGNOSIS — R509 Fever, unspecified: Secondary | ICD-10-CM | POA: Insufficient documentation

## 2010-10-12 DIAGNOSIS — I10 Essential (primary) hypertension: Secondary | ICD-10-CM | POA: Insufficient documentation

## 2010-10-12 DIAGNOSIS — Z8711 Personal history of peptic ulcer disease: Secondary | ICD-10-CM | POA: Insufficient documentation

## 2010-10-12 DIAGNOSIS — Z8611 Personal history of tuberculosis: Secondary | ICD-10-CM | POA: Insufficient documentation

## 2010-10-12 DIAGNOSIS — Z79899 Other long term (current) drug therapy: Secondary | ICD-10-CM | POA: Insufficient documentation

## 2010-10-12 DIAGNOSIS — E119 Type 2 diabetes mellitus without complications: Secondary | ICD-10-CM | POA: Insufficient documentation

## 2010-10-12 DIAGNOSIS — R000 Tachycardia, unspecified: Secondary | ICD-10-CM | POA: Insufficient documentation

## 2010-10-12 DIAGNOSIS — R112 Nausea with vomiting, unspecified: Secondary | ICD-10-CM | POA: Insufficient documentation

## 2010-10-12 DIAGNOSIS — R1013 Epigastric pain: Secondary | ICD-10-CM | POA: Insufficient documentation

## 2010-10-12 DIAGNOSIS — M549 Dorsalgia, unspecified: Secondary | ICD-10-CM | POA: Insufficient documentation

## 2010-10-12 DIAGNOSIS — G8929 Other chronic pain: Secondary | ICD-10-CM | POA: Insufficient documentation

## 2010-10-12 LAB — COMPREHENSIVE METABOLIC PANEL
ALT: 26 U/L (ref 0–53)
AST: 24 U/L (ref 0–37)
Albumin: 4 g/dL (ref 3.5–5.2)
Alkaline Phosphatase: 51 U/L (ref 39–117)
BUN: 10 mg/dL (ref 6–23)
CO2: 26 mEq/L (ref 19–32)
Calcium: 9.5 mg/dL (ref 8.4–10.5)
Chloride: 102 mEq/L (ref 96–112)
Creatinine, Ser: 0.71 mg/dL (ref 0.50–1.35)
GFR calc Af Amer: >60 mL/min (ref >60)
GFR calc non Af Amer: >60 mL/min (ref >60)
Glucose, Bld: 96 mg/dL (ref 70–99)
Potassium: 3.7 mEq/L (ref 3.5–5.1)
Sodium: 139 mEq/L (ref 135–145)
Total Bilirubin: 0.4 mg/dL (ref 0.3–1.2)
Total Protein: 7.9 g/dL (ref 6.0–8.3)

## 2010-10-12 LAB — LACTIC ACID, PLASMA: Lactic Acid, Venous: 2.1 mmol/L (ref 0.5–2.2)

## 2010-10-12 LAB — CBC
Platelets: 173 10*3/uL (ref 150–400)
RDW: 12.4 % (ref 11.5–15.5)
WBC: 15.8 10*3/uL — ABNORMAL HIGH (ref 4.0–10.5)

## 2010-10-12 LAB — LIPASE, BLOOD: Lipase: 39 U/L (ref 11–59)

## 2010-10-12 LAB — DIFFERENTIAL
Basophils Absolute: 0 10*3/uL (ref 0.0–0.1)
Basophils Relative: 0 % (ref 0–1)
Eosinophils Absolute: 0 10*3/uL (ref 0.0–0.7)
Eosinophils Relative: 0 % (ref 0–5)
Lymphs Abs: 2 10*3/uL (ref 0.7–4.0)
Monocytes Relative: 4 % (ref 3–12)
Neutro Abs: 13.1 10*3/uL — ABNORMAL HIGH (ref 1.7–7.7)
Neutrophils Relative %: 83 % — ABNORMAL HIGH (ref 43–77)

## 2010-10-12 LAB — URINALYSIS, ROUTINE W REFLEX MICROSCOPIC
Bilirubin Urine: NEGATIVE
Hgb urine dipstick: NEGATIVE
Nitrite: NEGATIVE
Specific Gravity, Urine: 1.011 (ref 1.005–1.030)
pH: 8.5 — ABNORMAL HIGH (ref 5.0–8.0)

## 2010-10-12 LAB — PROCALCITONIN: Procalcitonin: 1.07 ng/mL

## 2010-10-12 MED ORDER — IOHEXOL 300 MG/ML  SOLN
80.0000 mL | Freq: Once | INTRAMUSCULAR | Status: AC | PRN
  Administered 2010-10-12: 80 mL via INTRAVENOUS

## 2010-10-13 ENCOUNTER — Emergency Department (HOSPITAL_COMMUNITY)
Admission: EM | Admit: 2010-10-13 | Discharge: 2010-10-13 | Disposition: A | Discharge status: Home / Self Care | Payer: Medicaid Other | Attending: Emergency Medicine | Admitting: Emergency Medicine

## 2010-10-18 LAB — CULTURE, BLOOD (ROUTINE X 2): Culture: NO GROWTH

## 2010-10-26 ENCOUNTER — Ambulatory Visit: Payer: Medicaid Other

## 2010-11-02 ENCOUNTER — Ambulatory Visit: Payer: Medicaid Other

## 2010-11-09 ENCOUNTER — Ambulatory Visit: Payer: Medicaid Other | Attending: Family Medicine | Admitting: Physical Therapy

## 2010-11-09 DIAGNOSIS — M6281 Muscle weakness (generalized): Secondary | ICD-10-CM | POA: Insufficient documentation

## 2010-11-09 DIAGNOSIS — IMO0001 Reserved for inherently not codable concepts without codable children: Secondary | ICD-10-CM | POA: Insufficient documentation

## 2010-11-09 DIAGNOSIS — M2550 Pain in unspecified joint: Secondary | ICD-10-CM | POA: Insufficient documentation

## 2010-11-09 DIAGNOSIS — M2560 Stiffness of unspecified joint, not elsewhere classified: Secondary | ICD-10-CM | POA: Insufficient documentation

## 2010-12-07 ENCOUNTER — Ambulatory Visit: Payer: Medicaid Other | Admitting: Rehabilitative and Restorative Service Providers"

## 2010-12-24 LAB — I-STAT 8, (EC8 V) (CONVERTED LAB)
Chloride: 105
HCT: 44
Hemoglobin: 15
Operator id: 235561
Potassium: 3.7
Sodium: 138
TCO2: 27

## 2010-12-24 LAB — POCT H PYLORI SCREEN: H. PYLORI SCREEN, POC: NEGATIVE

## 2010-12-24 LAB — DIFFERENTIAL
Eosinophils Absolute: 0.3
Lymphocytes Relative: 49 — ABNORMAL HIGH
Lymphs Abs: 3.9
Monocytes Relative: 8
Neutro Abs: 3
Neutrophils Relative %: 38 — ABNORMAL LOW

## 2010-12-24 LAB — CBC
MCV: 87.3
RBC: 4.73
WBC: 7.9

## 2010-12-24 LAB — POCT I-STAT CREATININE
Creatinine, Ser: 0.9
Operator id: 235561

## 2011-01-04 LAB — GLUCOSE, CAPILLARY: Glucose-Capillary: 82

## 2011-07-24 ENCOUNTER — Emergency Department (HOSPITAL_COMMUNITY)
Admission: EM | Admit: 2011-07-24 | Discharge: 2011-07-24 | Disposition: A | Discharge status: Home / Self Care | Payer: Medicaid Other | Source: Home / Self Care | Attending: Family Medicine | Admitting: Family Medicine

## 2011-07-24 ENCOUNTER — Encounter (HOSPITAL_COMMUNITY): Payer: Self-pay

## 2011-07-24 DIAGNOSIS — L739 Follicular disorder, unspecified: Secondary | ICD-10-CM

## 2011-07-24 DIAGNOSIS — H04129 Dry eye syndrome of unspecified lacrimal gland: Secondary | ICD-10-CM

## 2011-07-24 DIAGNOSIS — L738 Other specified follicular disorders: Secondary | ICD-10-CM

## 2011-07-24 DIAGNOSIS — H04123 Dry eye syndrome of bilateral lacrimal glands: Secondary | ICD-10-CM

## 2011-07-24 HISTORY — DX: Essential (primary) hypertension: I10

## 2011-07-24 MED ORDER — DOXYCYCLINE HYCLATE 100 MG PO CAPS: 100.0000 mg | ORAL_CAPSULE | Freq: Two times a day (BID) | ORAL | Status: AC

## 2011-07-24 MED ORDER — POLYETHYL GLYCOL-PROPYL GLYCOL 0.4-0.3 % OP SOLN: 2.0000 [drp] | Freq: Three times a day (TID) | OPHTHALMIC | Status: DC | PRN

## 2011-07-24 NOTE — Discharge Instructions (Signed)
Can clean the area with soap and water let dry well and put an over-the-counter antibiotic ointment like Neosporin. Avoid scratching. Do not shave the area. Take the prescribed medications as instructed. Return or followup with your primary care provider is increased tenderness redness or drainage despite following treatment.

## 2011-07-24 NOTE — ED Notes (Signed)
Pt has painful lesion on back of head for four days with pain radiating down neck.

## 2011-07-24 NOTE — ED Provider Notes (Signed)
History     CSN: [id number]  Arrival date & time 07/24/11  1441   First MD Initiated Contact with Patient 07/24/11 1513      Chief Complaint  Patient presents with  . Neck Pain    (Consider location/radiation/quality/duration/timing/severity/associated sxs/prior treatment) HPI Comments: 56 year old male with history of diabetes and high blood pressure. Here complaining of tenderness and drainage in the back of his neck for 4 days. Denies fever or chills. He does not shave that area but he clips very short. States area was itchy initiallly and patient has been scratching before he noticed the discharge. No taking any medication for his symptoms. Also complaining of dry eyes. Denies visual changes. He has had a retinal exam at [facility] this year.   Past Medical History  Diagnosis Date  . Diabetes mellitus   . Hypertension     History reviewed. No pertinent past surgical history.  History reviewed. No pertinent family history.  History  Substance Use Topics  . Smoking status: Never Smoker   . Smokeless tobacco: Not on file  . Alcohol Use: No      Review of Systems  Constitutional: Negative for fever and chills.  HENT: Positive for neck pain.   Eyes: Negative for pain, discharge, itching and visual disturbance.       Dryness ans per HPI  Gastrointestinal: Negative for nausea, vomiting and abdominal pain.  Musculoskeletal: Negative for myalgias and arthralgias.  Skin: Positive for rash.       As per HPI.  Neurological: Negative for headaches.    Allergies  Review of patient's allergies indicates no known allergies.  Home Medications   Current Outpatient Rx  Name Route Sig Dispense Refill  . GABAPENTIN 300 MG PO CAPS Oral Take 300 mg by mouth 3 (three) times daily.    Marland Kitchen GEMFIBROZIL 600 MG PO TABS Oral Take 600 mg by mouth 2 (two) times daily before a meal.    . LISINOPRIL 5 MG PO TABS Oral Take 5 mg by mouth daily.    Marland Kitchen METFORMIN HCL ER (MOD) 500 MG PO TB24  Oral Take 500 mg by mouth daily with breakfast.    . OMEPRAZOLE 40 MG PO CPDR Oral Take 40 mg by mouth daily.    . TRAMADOL HCL 50 MG PO TABS Oral Take 50 mg by mouth every 6 (six) hours as needed.    Marland Kitchen DOXYCYCLINE HYCLATE 100 MG PO CAPS Oral Take 1 capsule (100 mg total) by mouth 2 (two) times daily. 20 capsule 0  . POLYETHYL GLYCOL-PROPYL GLYCOL 0.4-0.3 % OP SOLN Ophthalmic Apply 2 drops to eye 3 (three) times daily as needed. 5 mL 0    BP 117/78  Pulse 102  Temp(Src) 98.4 F (36.9 C) (Oral)  Resp 19  SpO2 95%  Physical Exam  Nursing note and vitals reviewed. Constitutional: He is oriented to person, place, and time. He appears well-developed and well-nourished. No distress.  HENT:  Head: Normocephalic and atraumatic.  Right Ear: External ear normal.  Left Ear: External ear normal.  Mouth/Throat: No oropharyngeal exudate.  Eyes: Conjunctivae and EOM are normal. Pupils are equal, round, and reactive to light. Right eye exhibits no discharge. Left eye exhibits no discharge. No scleral icterus.  Neck: Neck supple. No thyromegaly present.  Cardiovascular: Normal heart sounds.   Pulmonary/Chest: Breath sounds normal.  Lymphadenopathy:    He has no cervical adenopathy.  Neurological: He is alert and oriented to person, place, and time.  Skin:  Pustular rash at the expense of hair follicles between low scalp and upper posterior neck above hair line, pustular with crusting and trasudate. No significant redness, induration, swelling or fluctuation of the base scalp in the affected area. No purulent drainage with pressure.      ED Course  Procedures (including critical care time)  Labs Reviewed - No data to display No results found.   1. Folliculitis   2. Dry eyes, bilateral       MDM  Impress hairline folliculitis of the back of the neck plus dry eye syndrome. Prescribed doxycycline and Systane eyedrops. Patient reports well controlled diabetes. Asked to followup with  his primary doctor or return here if worsening symptoms despite following treatment.        Sharin Grave, MD 07/25/11 1338

## 2011-12-26 ENCOUNTER — Other Ambulatory Visit: Payer: Self-pay | Admitting: Diagnostic Neuroimaging

## 2011-12-26 DIAGNOSIS — R413 Other amnesia: Secondary | ICD-10-CM

## 2012-01-02 ENCOUNTER — Ambulatory Visit
Admission: RE | Admit: 2012-01-02 | Discharge: 2012-01-02 | Disposition: A | Discharge status: Home / Self Care | Payer: Medicaid Other | Source: Ambulatory Visit | Attending: Diagnostic Neuroimaging | Admitting: Diagnostic Neuroimaging

## 2012-01-02 DIAGNOSIS — R413 Other amnesia: Secondary | ICD-10-CM

## 2012-04-04 HISTORY — PX: COLONOSCOPY: SHX174

## 2012-08-25 IMAGING — CT CT ABD-PELV W/ CM
2 of 5 series · 14 of 32 positions shown, 19 images · IV contrast (agent unspecified)
Comparison: None.

CLINICAL DATA: Fever with low abdominal and pelvic pain.  History
of hypertension and diabetes.

CT ABDOMEN AND PELVIS WITH CONTRAST
TECHNIQUE: Multidetector CT imaging of the abdomen and pelvis was
performed following the standard protocol during bolus
administration of intravenous contrast.
Contrast: 80 ml 6mnipaque-IPP intravenously.

[Series 2: routine abdomen · axial · 0.70mm/px · z∈[-329,-29]mm · 6 of 84 slices shown, 11 images]
[im 12/84  soft-tissue]
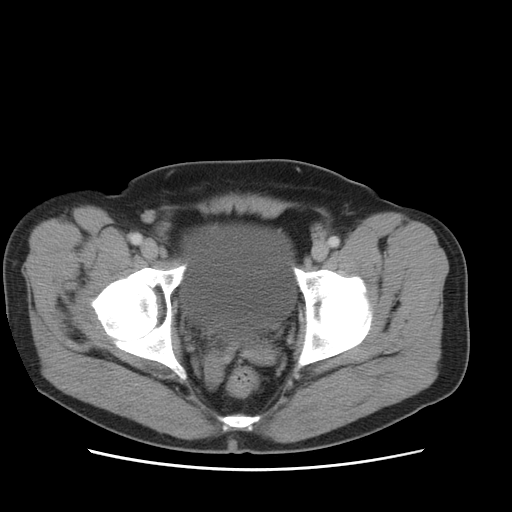
[im 12/84  bone]
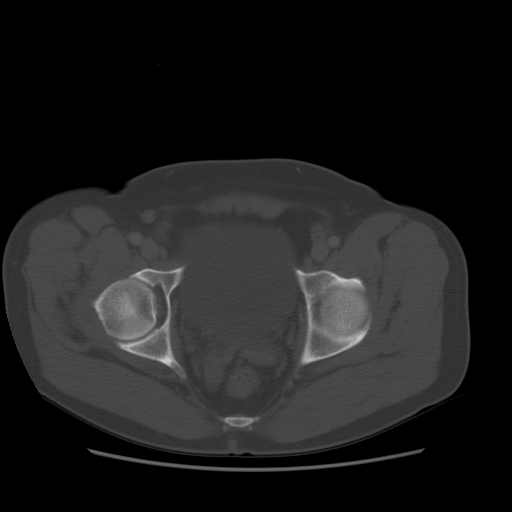
[im 24/84  soft-tissue]
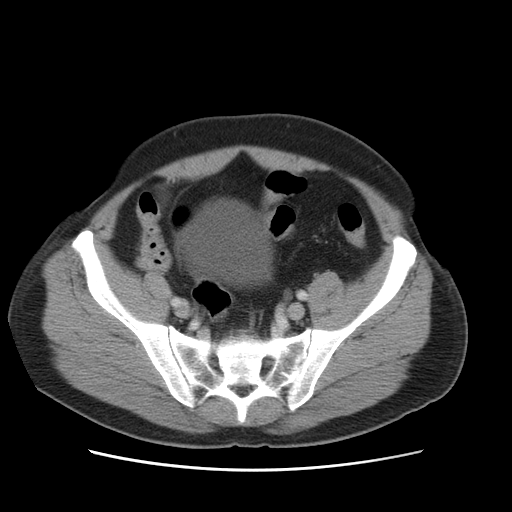
[im 36/84  soft-tissue]
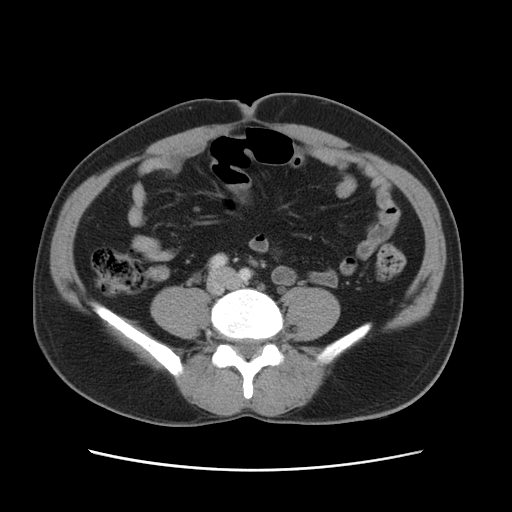
[im 36/84  lung]
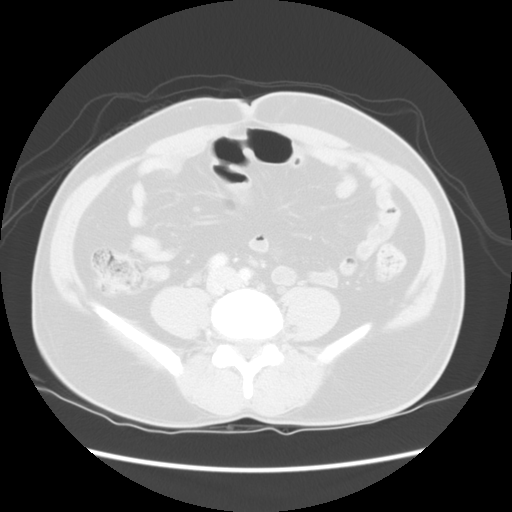
[im 48/84  soft-tissue]
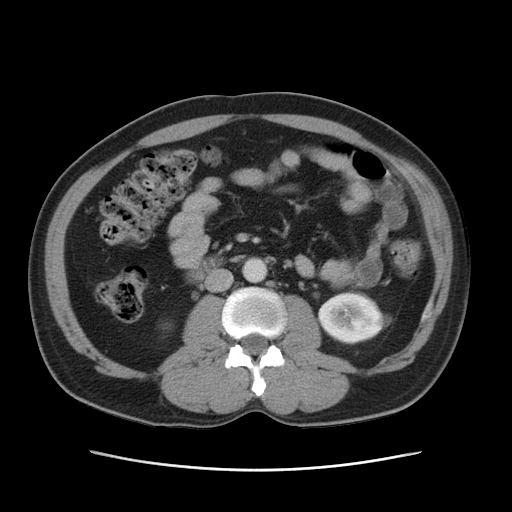
[im 48/84  lung]
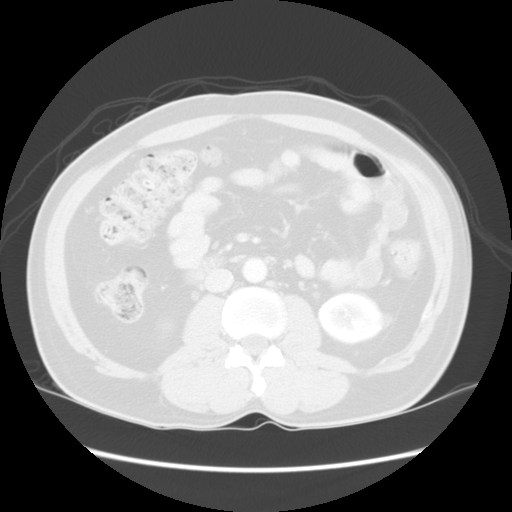
[im 60/84  soft-tissue]
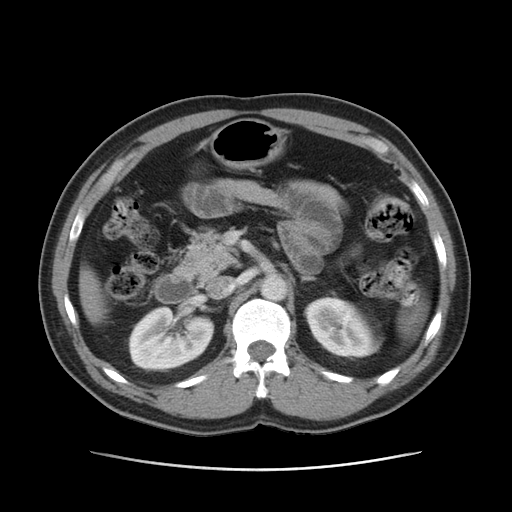
[im 60/84  lung]
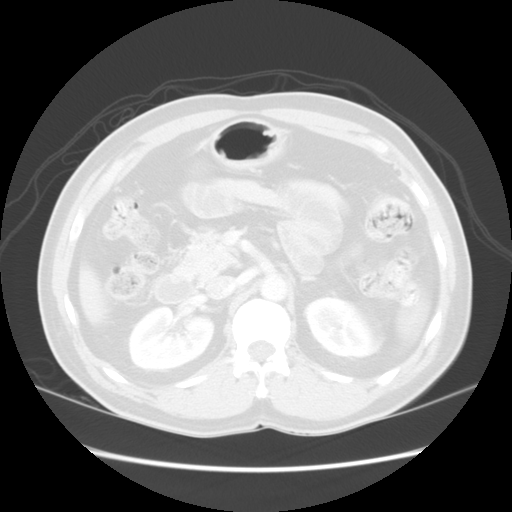
[im 72/84  soft-tissue]
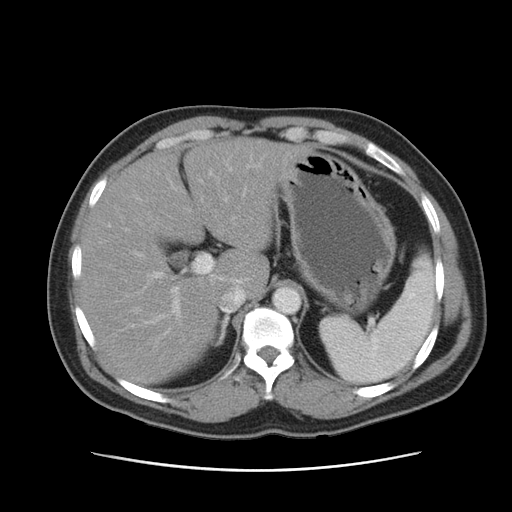
[im 72/84  lung]
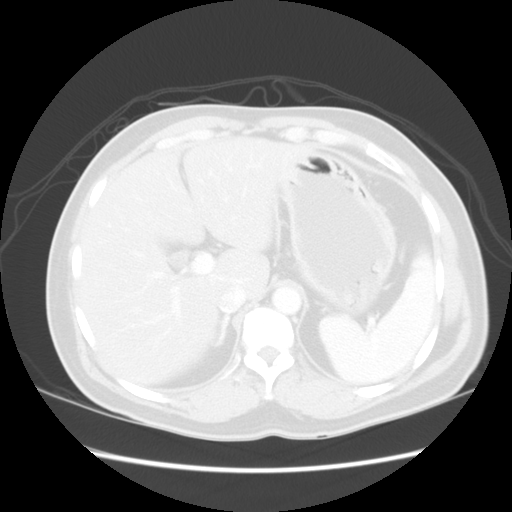

[Series 401: sagittals · sagittal · 0.86mm/px · 8 of 99 slices shown]
[im 11/99  soft-tissue]
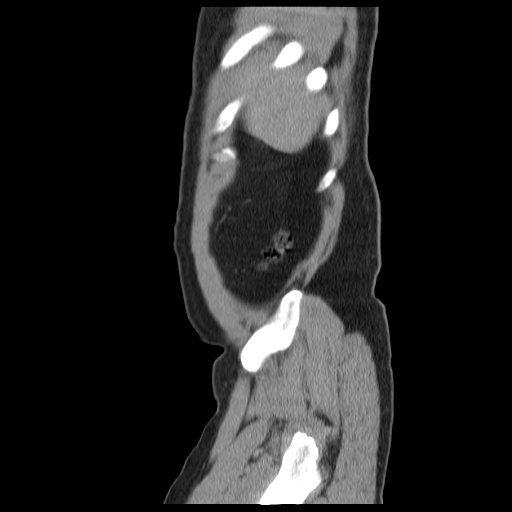
[im 22/99  soft-tissue]
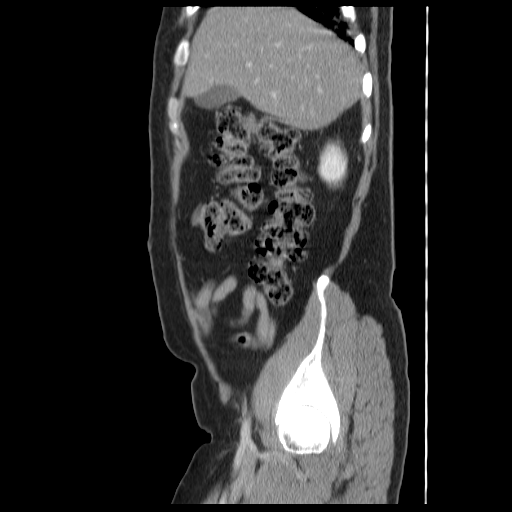
[im 33/99  soft-tissue]
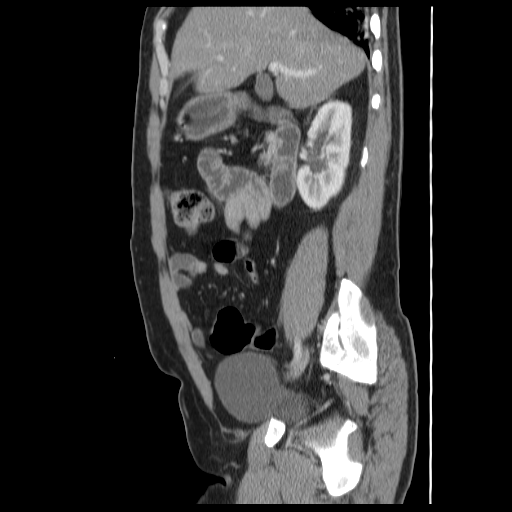
[im 44/99  soft-tissue]
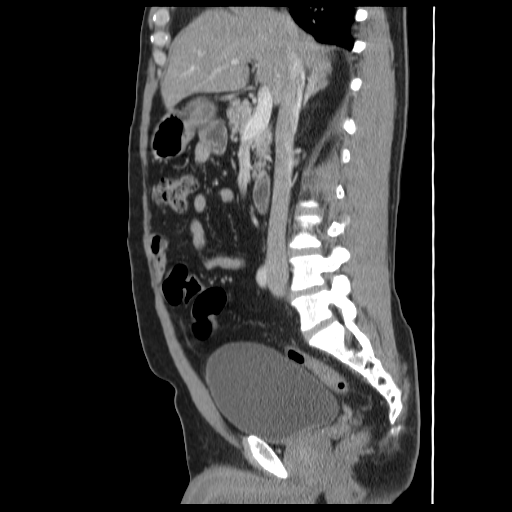
[im 55/99  soft-tissue]
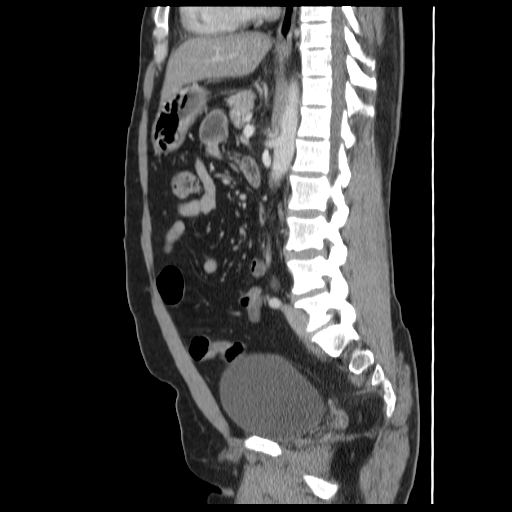
[im 66/99  soft-tissue]
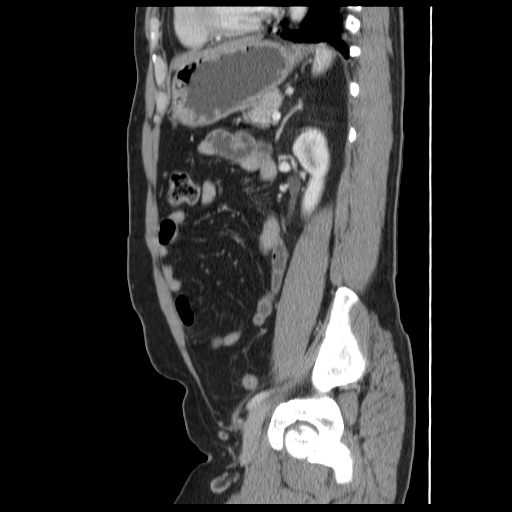
[im 77/99  soft-tissue]
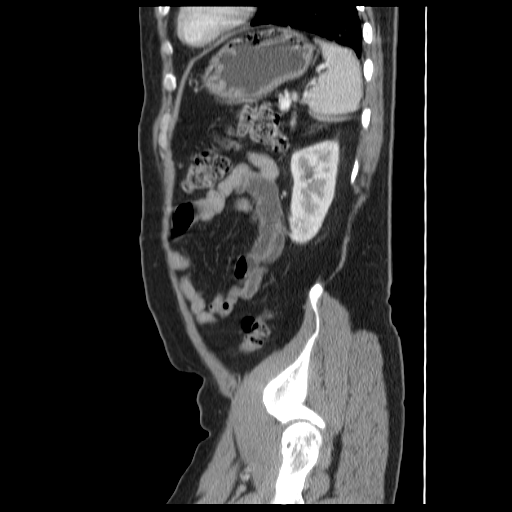
[im 88/99  soft-tissue]
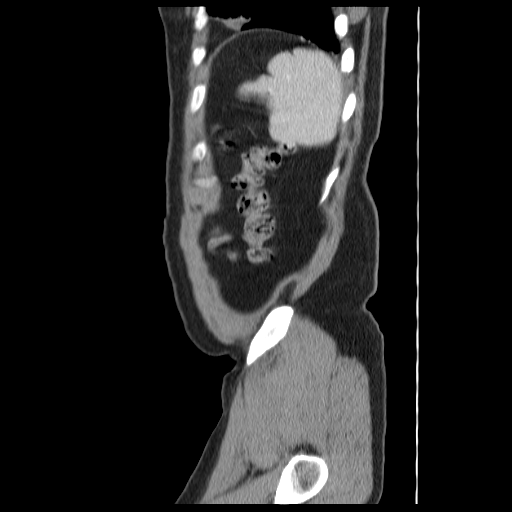

[14 of 32 positions shown; findings below may reference images not displayed]

FINDINGS: There is dependent atelectasis in both lung bases.  No
significant pleural effusion is demonstrated.  There is a small
hiatal hernia.

The liver, spleen, gallbladder, pancreas and biliary system appear
unremarkable.  There is no adrenal mass.  There is 11 mm cyst
projecting laterally from the lower pole of the left kidney.  The
right kidney appears normal.  There is a slight prominence of both
ureters attributed to bladder distension.  There is no evidence of
renal or ureteral calculus.  There is no delay in contrast
excretion.

The bowel gas pattern is normal.  The appendix is not visualized.
The terminal ileum appears normal.  The prostate gland is mildly
enlarged.

There are no acute osseous findings.  There is no significant
spondylosis for age.
IMPRESSION: 1.  No acute abdominal pelvic findings.
2.  Mild bladder distension likely accounts for mild ureteral
dilatation bilaterally.  There is no delay in contrast excretion.
3.  Small left renal cyst and mild enlargement of the prostate
gland.

## 2013-05-25 ENCOUNTER — Emergency Department (INDEPENDENT_AMBULATORY_CARE_PROVIDER_SITE_OTHER)
Admission: EM | Admit: 2013-05-25 | Discharge: 2013-05-25 | Disposition: A | Discharge status: Home / Self Care | Payer: Medicaid Other | Source: Home / Self Care | Attending: Emergency Medicine | Admitting: Emergency Medicine

## 2013-05-25 ENCOUNTER — Emergency Department (INDEPENDENT_AMBULATORY_CARE_PROVIDER_SITE_OTHER): Payer: Medicaid Other

## 2013-05-25 ENCOUNTER — Encounter (HOSPITAL_COMMUNITY): Payer: Self-pay | Admitting: Emergency Medicine

## 2013-05-25 DIAGNOSIS — J020 Streptococcal pharyngitis: Secondary | ICD-10-CM

## 2013-05-25 LAB — POCT RAPID STREP A: STREPTOCOCCUS, GROUP A SCREEN (DIRECT): POSITIVE — AB

## 2013-05-25 MED ORDER — AMOXICILLIN 500 MG PO CAPS: 500.0000 mg | ORAL_CAPSULE | Freq: Three times a day (TID) | ORAL | Status: DC

## 2013-05-25 NOTE — ED Notes (Signed)
Pt  Reports  Symptoms  Of  fever     sorethroat       Body   Aches        Since  Yesterday     Pt  Has  Been taking  Tylenol  For  The  Symptoms             Pt  Has  A  History  Of  DM      And  HTN            Pt  States  Glucose  Was  85

## 2013-05-25 NOTE — ED Provider Notes (Signed)
Chief Complaint   Chief Complaint  Patient presents with  . Fever    History of Present Illness   Levi Ross is a 58 year old male who's had a two-day history of fever, sore throat, abdominal and chest pain. He denies any headache, nasal congestion, rhinorrhea, earache, swollen glands, stiff neck, cough, shortness of breath, wheezing, nausea, or vomiting. He's had no known sick exposures.   Review of Systems   Other than as noted above, the patient denies any of the following symptoms. Systemic:  No fever, chills, sweats, myalgias, or headache. Eye:  No redness, pain or drainage. ENT:  No earache, nasal congestion, sneezing, rhinorrhea, sinus pressure, sinus pain, or post nasal drip. Lungs:  No cough, sputum production, wheezing, shortness of breath, or chest pain. GI:  No abdominal pain, nausea, vomiting, or diarrhea. Skin:  No rash.  PMFSH   Past medical history, family history, social history, meds, and allergies were reviewed. He has diabetes, hypertension, and chronic back problems. Current meds include gabapentin, Lopid, lisinopril, omeprazole, tramadol, and metformin. He has no known medication allergies.  Physical Exam     Vital signs:  BP 118/80  Pulse 103  Temp(Src) 99.9 F (37.7 C) (Oral)  Resp 22  SpO2 94% General:  Alert, in no distress. Phonation was normal, no drooling, and patient was able to handle secretions well.  Eye:  No conjunctival injection or drainage. Lids were normal. ENT:  TMs and canals were normal, without erythema or inflammation.  Nasal mucosa was clear and uncongested, without drainage.  Mucous membranes were moist.  Exam of pharynx reveals tonsils to be enlarged, red, with spots of white exudate.  There were no oral ulcerations or lesions. There was no bulging of the tonsillar pillars, and the uvula was midline. Neck:  Supple, no adenopathy, tenderness or mass. Lungs:  No respiratory distress.  He has rales at the right lung base, no  wheezes or rhonchi.  Heart:  Regular rhythm, without gallops, murmers or rubs. Skin:  Clear, warm, and dry, without rash or lesions.  Labs   Results for orders placed during the hospital encounter of 05/25/13  POCT RAPID STREP A ([facility] ONLY)      Result Value Ref Range   Streptococcus, Group A Screen (Direct) POSITIVE (*) NEGATIVE   Radiology   Dg Chest 2 View  05/25/2013   CLINICAL DATA:  Fever.  Abnormal breath sounds a right lung base.  EXAM: CHEST  2 VIEW  COMPARISON:  10/12/2010  FINDINGS: The heart size and mediastinal contours are within normal limits. Both lungs are clear. The visualized skeletal structures are unremarkable.  IMPRESSION: No active cardiopulmonary disease.   Electronically Signed   By: Myles RosenthalJohn  Stahl M.D.   On: 05/25/2013 16:37   I reviewed the images independently and personally and concur with the radiologist's findings.  Assessment   The encounter diagnosis was Strep throat.  There is no evidence of a peritonsillar abscess.    Plan     1.  Meds:  The following meds were prescribed:   Discharge Medication List as of 05/25/2013  5:02 PM    START taking these medications   Details  amoxicillin (AMOXIL) 500 MG capsule Take 1 capsule (500 mg total) by mouth 3 (three) times daily., Starting 05/25/2013, Until Discontinued, Normal        2.  Patient Education/Counseling:  The patient was given appropriate handouts, self care instructions, and instructed in symptomatic relief, including hot saline gargles, throat lozenges, infectious  precautions, and need to trade out toothbrush.    3.  Follow up:  The patient was told to follow up here if no better in 3 to 4 days, or sooner if becoming worse in any way, and given some red flag symptoms such as difficulty swallowing or breathing which would prompt immediate return.       Reuben Likes, MD 05/25/13 2108

## 2013-05-25 NOTE — Discharge Instructions (Signed)
Strep Throat  Strep throat is an infection of the throat caused by a bacteria named Streptococcus pyogenes. Your caregiver may call the infection streptococcal "tonsillitis" or "pharyngitis" depending on whether there are signs of inflammation in the tonsils or back of the throat. Strep throat is most common in children aged 58 15 years during the cold months of the year, but it can occur in people of any age during any season. This infection is spread from person to person (contagious) through coughing, sneezing, or other close contact.  SYMPTOMS   · Fever or chills.  · Painful, swollen, red tonsils or throat.  · Pain or difficulty when swallowing.  · White or yellow spots on the tonsils or throat.  · Swollen, tender lymph nodes or "glands" of the neck or under the jaw.  · Red rash all over the body (rare).  DIAGNOSIS   Many different infections can cause the same symptoms. A test must be done to confirm the diagnosis so the right treatment can be given. A "rapid strep test" can help your caregiver make the diagnosis in a few minutes. If this test is not available, a light swab of the infected area can be used for a throat culture test. If a throat culture test is done, results are usually available in a day or two.  TREATMENT   Strep throat is treated with antibiotic medicine.  HOME CARE INSTRUCTIONS   · Gargle with 1 tsp of salt in 1 cup of warm water, 3 4 times per day or as needed for comfort.  · Family members who also have a sore throat or fever should be tested for strep throat and treated with antibiotics if they have the strep infection.  · Make sure everyone in your household washes their hands well.  · Do not share food, drinking cups, or personal items that could cause the infection to spread to others.  · You may need to eat a soft food diet until your sore throat gets better.  · Drink enough water and fluids to keep your urine clear or pale yellow. This will help prevent dehydration.  · Get plenty of  rest.  · Stay home from school, daycare, or work until you have been on antibiotics for 24 hours.  · Only take over-the-counter or prescription medicines for pain, discomfort, or fever as directed by your caregiver.  · If antibiotics are prescribed, take them as directed. Finish them even if you start to feel better.  SEEK MEDICAL CARE IF:   · The glands in your neck continue to enlarge.  · You develop a rash, cough, or earache.  · You cough up green, yellow-brown, or bloody sputum.  · You have pain or discomfort not controlled by medicines.  · Your problems seem to be getting worse rather than better.  SEEK IMMEDIATE MEDICAL CARE IF:   · You develop any new symptoms such as vomiting, severe headache, stiff or painful neck, chest pain, shortness of breath, or trouble swallowing.  · You develop severe throat pain, drooling, or changes in your voice.  · You develop swelling of the neck, or the skin on the neck becomes red and tender.  · You have a fever.  · You develop signs of dehydration, such as fatigue, dry mouth, and decreased urination.  · You become increasingly sleepy, or you cannot wake up completely.  Document Released: 03/18/2000 Document Revised: 03/07/2012 Document Reviewed: 05/20/2010  Exit[employer]® Patient Information ©2014 [employer].

## 2013-06-01 ENCOUNTER — Encounter (HOSPITAL_COMMUNITY): Payer: Self-pay | Admitting: Emergency Medicine

## 2013-06-01 ENCOUNTER — Emergency Department (INDEPENDENT_AMBULATORY_CARE_PROVIDER_SITE_OTHER)
Admission: EM | Admit: 2013-06-01 | Discharge: 2013-06-01 | Disposition: A | Discharge status: Home / Self Care | Payer: Medicaid Other | Source: Home / Self Care | Attending: Emergency Medicine | Admitting: Emergency Medicine

## 2013-06-01 DIAGNOSIS — Z789 Other specified health status: Secondary | ICD-10-CM

## 2013-06-01 DIAGNOSIS — Z888 Allergy status to other drugs, medicaments and biological substances status: Secondary | ICD-10-CM

## 2013-06-01 DIAGNOSIS — R51 Headache: Secondary | ICD-10-CM

## 2013-06-01 DIAGNOSIS — R519 Headache, unspecified: Secondary | ICD-10-CM

## 2013-06-01 MED ORDER — AZITHROMYCIN 250 MG PO TABS: ORAL_TABLET | ORAL | Status: DC

## 2013-06-01 MED ORDER — SIMETHICONE 125 MG PO CHEW: 125.0000 mg | CHEWABLE_TABLET | Freq: Four times a day (QID) | ORAL | Status: DC | PRN

## 2013-06-01 NOTE — ED Notes (Addendum)
C/o diarrhea and headache onset 4 days ago.  Started Amoxicillin 2/21 for strep throat and fever.  He thinks its a reaction to the medicine.  No rash or itching.  C/o gas on his stomach at night.

## 2013-06-01 NOTE — ED Provider Notes (Signed)
CSN: [id number]     Arrival date & time 06/01/13  1527 History   First MD Initiated Contact with Patient 06/01/13 1706     Chief Complaint  Patient presents with  . Diarrhea  . Headache   (Consider location/radiation/quality/duration/timing/severity/associated sxs/prior Treatment) HPI 58 yo M presenting with diarrhea and headache.  Family member is present and acted as Equities traderinterpreter when needed.  He reports being started on amoxicllin for strep throat on 2/21.  2-3 days after starting the amoxicillin, he developed diarrhea, worsening gas and intermittent headache.  The diarrhea is loose to watery, non-bloody.  Initially was 6-8 times a day, but now 4-5 times a day.  Reports worse gas than normal over this time frame as well, particularly at night.  He has tried hot water without improvement.  The headache is located globally, it comes and goes, lasts for a hour or so at a time.  No associated nausea, photophobia, phonophobia, neurologic deficits.  Tylenol helps a little, but not much.    He has a history of a perforated stomach ulcer requiring surgery.  He has some chronic GI complaints, including pain and gas from this.  He takes omeprazole 40mg  BID.  He has had both upper and lower endoscopies that were normal.  Past Medical History  Diagnosis Date  . Diabetes mellitus   . Hypertension    Past Surgical History  Procedure Laterality Date  . [place] perforated [place]  . Upper gi endoscopy    . Colonoscopy  2014   Family History  Problem Relation Age of Onset  . Hypertension Mother   . Diabetes Brother    History  Substance Use Topics  . Smoking status: Never Smoker   . Smokeless tobacco: Not on file  . Alcohol Use: No    Review of Systems  Constitutional: Negative.   Respiratory: Negative.   Cardiovascular: Negative.   Gastrointestinal: Positive for diarrhea. Negative for nausea, vomiting, abdominal pain,  constipation, blood in stool, abdominal distention and rectal pain.  Genitourinary: Negative.   Musculoskeletal: Positive for back pain (chronic).  Neurological: Positive for headaches. Negative for dizziness, syncope, weakness and numbness.    Allergies  Review of patient's allergies indicates no known allergies.  Home Medications   Current Outpatient Rx  Name  Route  Sig  Dispense  Refill  . gabapentin (NEURONTIN) 300 MG capsule   Oral   Take 300 mg by mouth 2 (two) times daily.          Marland Kitchen. gemfibrozil (LOPID) 600 MG tablet   Oral   Take 600 mg by mouth 2 (two) times daily before a meal.         . lisinopril (PRINIVIL,ZESTRIL) 5 MG tablet   Oral   Take 5 mg by mouth daily.         . metFORMIN (GLUMETZA) 500 MG (MOD) 24 hr tablet   Oral   Take 500 mg by mouth daily with breakfast.         . omeprazole (PRILOSEC) 40 MG capsule   Oral   Take 40 mg by mouth daily.         Bertram Gala. Polyethyl Glycol-Propyl Glycol 0.4-0.3 % SOLN   Ophthalmic   Apply 2 drops to eye 3 (three) times daily as needed.   5 mL   0   . traMADol (ULTRAM) 50 MG tablet   Oral   Take  50 mg by mouth every 6 (six) hours as needed.         Marland Kitchen azithromycin (ZITHROMAX) 250 MG tablet      Take 1 pill daily until gone.   4 each   0   . simethicone (MYLANTA GAS) 125 MG chewable tablet   Oral   Chew 1 tablet (125 mg total) by mouth every 6 (six) hours as needed for flatulence (gas).   30 tablet   0    BP 132/85  Pulse 85  Temp(Src) 98.6 F (37 C) (Oral)  Resp 18  SpO2 97% Physical Exam  Constitutional: He is oriented to person, place, and time. He appears well-developed and well-nourished. No distress.  HENT:  Head: Normocephalic and atraumatic.  Mouth/Throat: Oropharynx is clear and moist. No oropharyngeal exudate.  Eyes: Conjunctivae and EOM are normal. Pupils are equal, round, and reactive to light. Right eye exhibits no discharge. Left eye exhibits no discharge.  Neck: Normal range of  motion. Neck supple.  Cardiovascular: Normal rate, regular rhythm and normal heart sounds.   No murmur heard. Pulmonary/Chest: Effort normal and breath sounds normal. No respiratory distress. He has no wheezes. He has no rales.  Abdominal: Soft. Bowel sounds are normal. He exhibits no distension and no mass. There is tenderness (mild epigastric). There is no rebound and no guarding.  Musculoskeletal: He exhibits no edema.  Neurological: He is alert and oriented to person, place, and time. He displays no atrophy and normal reflexes. No cranial nerve deficit or sensory deficit. He exhibits normal muscle tone.  Reflex Scores:      Bicep reflexes are 1+ on the right side and 1+ on the left side.      Patellar reflexes are 1+ on the right side and 1+ on the left side. Skin: Skin is warm and dry. No rash noted. He is not diaphoretic.  Psychiatric: Judgment and thought content normal.    ED Course  Procedures (including critical care time) Labs Review Labs Reviewed - No data to display Imaging Review No results found.   MDM   1. Medication intolerance    Diarrhea and increased gas likely secondary to amoxicillin.  Will change amoxicillin to azithromycin for additional 4 days of antibiotic therapy for strep throat.  Discussed that diarrhea and gas should improve once off the antibiotics.  Can use mylanta prn for gas symptoms.  Headache is likely from mild dehydration secondary to diarrhea.  No red flags on history.  Neurologic exam is normal.  Discussed fluid intake of 8 glasses of water a day.  Follow up with PCP in 1 week to make sure things are back to normal.    Charm Rings, MD 06/01/13 1743

## 2013-06-01 NOTE — Discharge Instructions (Signed)
You are having an intolerance to the amoxicillin.  You are not allergic to it.  Please stop the amoxicillin. Take Azithromycin 1 pill daily until they are gone.  Your diarrhea and gas should improve once you finish all the antibiotics. In the meantime, you can take mylanta every 6 hours as needed for the gas.  Please drink 8 glasses of water a day.  Your headache is likely from being a little dehydrated.  Follow up with your regular doctor in about 1 week to make sure things are going back to normal.

## 2013-06-01 NOTE — ED Provider Notes (Signed)
Medical screening examination/treatment/procedure(s) were performed by a resident physician and as supervising physician I was immediately available for consultation/collaboration.  Leslee Homeavid Jenay Morici, M.D.   Reuben Likesavid C Tarrie Mcmichen, MD 06/01/13 2011

## 2013-11-17 ENCOUNTER — Emergency Department (HOSPITAL_COMMUNITY): Payer: Medicaid Other

## 2013-11-17 ENCOUNTER — Emergency Department (HOSPITAL_COMMUNITY)
Admission: EM | Admit: 2013-11-17 | Discharge: 2013-11-18 | Disposition: A | Discharge status: Home / Self Care | Payer: Medicaid Other | Attending: Emergency Medicine | Admitting: Emergency Medicine

## 2013-11-17 ENCOUNTER — Encounter (HOSPITAL_COMMUNITY): Payer: Self-pay | Admitting: Emergency Medicine

## 2013-11-17 DIAGNOSIS — Z9889 Other specified postprocedural states: Secondary | ICD-10-CM | POA: Insufficient documentation

## 2013-11-17 DIAGNOSIS — IMO0002 Reserved for concepts with insufficient information to code with codable children: Secondary | ICD-10-CM | POA: Insufficient documentation

## 2013-11-17 DIAGNOSIS — K859 Acute pancreatitis without necrosis or infection, unspecified: Secondary | ICD-10-CM | POA: Insufficient documentation

## 2013-11-17 DIAGNOSIS — Z79899 Other long term (current) drug therapy: Secondary | ICD-10-CM | POA: Diagnosis not present

## 2013-11-17 DIAGNOSIS — K2100 Gastro-esophageal reflux disease with esophagitis, without bleeding: Secondary | ICD-10-CM

## 2013-11-17 DIAGNOSIS — I10 Essential (primary) hypertension: Secondary | ICD-10-CM | POA: Diagnosis not present

## 2013-11-17 DIAGNOSIS — E119 Type 2 diabetes mellitus without complications: Secondary | ICD-10-CM | POA: Diagnosis not present

## 2013-11-17 DIAGNOSIS — K858 Other acute pancreatitis without necrosis or infection: Secondary | ICD-10-CM

## 2013-11-17 DIAGNOSIS — K3189 Other diseases of stomach and duodenum: Secondary | ICD-10-CM | POA: Diagnosis not present

## 2013-11-17 DIAGNOSIS — R079 Chest pain, unspecified: Secondary | ICD-10-CM | POA: Insufficient documentation

## 2013-11-17 DIAGNOSIS — R1013 Epigastric pain: Secondary | ICD-10-CM

## 2013-11-17 LAB — BASIC METABOLIC PANEL
Anion gap: 12 (ref 5–15)
BUN: 11 mg/dL (ref 6–23)
CHLORIDE: 100 meq/L (ref 96–112)
CO2: 26 meq/L (ref 19–32)
Calcium: 9.7 mg/dL (ref 8.4–10.5)
Creatinine, Ser: 0.8 mg/dL (ref 0.50–1.35)
GFR calc Af Amer: >90 mL/min (ref >90)
GFR calc non Af Amer: >90 mL/min (ref >90)
GLUCOSE: 136 mg/dL — AB (ref 70–99)
Potassium: 4.2 mEq/L (ref 3.7–5.3)
SODIUM: 138 meq/L (ref 137–147)

## 2013-11-17 LAB — CBC
HEMATOCRIT: 37.4 % — AB (ref 39.0–52.0)
HEMOGLOBIN: 12.8 g/dL — AB (ref 13.0–17.0)
MCH: 28.8 pg (ref 26.0–34.0)
MCHC: 34.2 g/dL (ref 30.0–36.0)
MCV: 84.2 fL (ref 78.0–100.0)
Platelets: 205 10*3/uL (ref 150–400)
RBC: 4.44 MIL/uL (ref 4.22–5.81)
RDW: 13 % (ref 11.5–15.5)
WBC: 6.6 10*3/uL (ref 4.0–10.5)

## 2013-11-17 LAB — I-STAT TROPONIN, ED: Troponin i, poc: 0 ng/mL (ref 0.00–0.08)

## 2013-11-17 MED ORDER — GI COCKTAIL ~~LOC~~
30.0000 mL | Freq: Once | ORAL | Status: AC
  Administered 2013-11-18: 30 mL via ORAL
  Filled 2013-11-17: qty 30

## 2013-11-17 MED ORDER — SUCRALFATE 1 G PO TABS
1.0000 g | ORAL_TABLET | Freq: Once | ORAL | Status: AC
  Administered 2013-11-18: 1 g via ORAL
  Filled 2013-11-17: qty 1

## 2013-11-17 NOTE — ED Notes (Addendum)
Pt presents with complaint of intermittent centralized chest pain and epigastric pain. Pt states it is a "burning sensation." Pt reports intermittent shortness of breath, increased sweating during chest pain. Pt reports having a history of hypertension, diabetes, and high cholesterol. Pt states the pain is improved by drinking water. Pt denies nausea and emesis. Pt has a history of acid reflux, however states he has not had many of these symptoms before.

## 2013-11-17 NOTE — ED Notes (Signed)
Pt c/o chest discomfort throughout the day. Has GERD but states this pain is different. Denies N/V, diaphoresis or deferred discomfort to shoulder, jaw, back, or neck.

## 2013-11-17 NOTE — ED Notes (Signed)
MD at bedside. 

## 2013-11-18 LAB — HEPATIC FUNCTION PANEL
ALT: 22 U/L (ref 0–53)
AST: 22 U/L (ref 0–37)
Albumin: 4 g/dL (ref 3.5–5.2)
Alkaline Phosphatase: 47 U/L (ref 39–117)
Total Bilirubin: 0.4 mg/dL (ref 0.3–1.2)
Total Protein: 7.9 g/dL (ref 6.0–8.3)

## 2013-11-18 LAB — LIPASE, BLOOD: Lipase: 84 U/L — ABNORMAL HIGH (ref 11–59)

## 2013-11-18 MED ORDER — OMEPRAZOLE 40 MG PO CPDR: 40.0000 mg | DELAYED_RELEASE_CAPSULE | Freq: Every day | ORAL | Status: AC

## 2013-11-18 MED ORDER — TRAMADOL HCL 50 MG PO TABS: 50.0000 mg | ORAL_TABLET | Freq: Four times a day (QID) | ORAL | Status: AC | PRN

## 2013-11-18 MED ORDER — SUCRALFATE 1 G PO TABS: 1.0000 g | ORAL_TABLET | Freq: Four times a day (QID) | ORAL | Status: DC

## 2013-11-18 NOTE — Discharge Instructions (Signed)
Take medications as prescribed.  Stick to a bland diet until feeling better.  Return to the ER for worsening condition or new concerning symptoms.  Contact your doctor for follow up in 3-5 days.   Abdominal Pain Many things can cause abdominal pain. Usually, abdominal pain is not caused by a disease and will improve without treatment. It can often be observed and treated at home. Your health care provider will do a physical exam and possibly order blood tests and X-rays to help determine the seriousness of your pain. However, in many cases, more time must pass before a clear cause of the pain can be found. Before that point, your health care provider may not know if you need more testing or further treatment. HOME CARE INSTRUCTIONS  Monitor your abdominal pain for any changes. The following actions may help to alleviate any discomfort you are experiencing:  Only take over-the-counter or prescription medicines as directed by your health care provider.  Do not take laxatives unless directed to do so by your health care provider.  Try a clear liquid diet (broth, tea, or water) as directed by your health care provider. Slowly move to a bland diet as tolerated. SEEK MEDICAL CARE IF:  You have unexplained abdominal pain.  You have abdominal pain associated with nausea or diarrhea.  You have pain when you urinate or have a bowel movement.  You experience abdominal pain that wakes you in the night.  You have abdominal pain that is worsened or improved by eating food.  You have abdominal pain that is worsened with eating fatty foods.  You have a fever. SEEK IMMEDIATE MEDICAL CARE IF:   Your pain does not go away within 2 hours.  You keep throwing up (vomiting).  Your pain is felt only in portions of the abdomen, such as the right side or the left lower portion of the abdomen.  You pass bloody or black tarry stools. MAKE SURE YOU:  Understand these instructions.   Will watch your  condition.   Will get help right away if you are not doing well or get worse.  Document Released: 12/29/2004 Document Revised: 03/26/2013 Document Reviewed: 11/28/2012 [facility] Patient Information 2015 [place], [place]. This information is not intended to replace advice given to you by your health care provider. Make sure you discuss any questions you have with your health care provider.   Acute Pancreatitis Acute pancreatitis is a disease in which the pancreas becomes suddenly inflamed. The pancreas is a large gland located behind your stomach. The pancreas produces enzymes that help digest food. The pancreas also releases the hormones glucagon and insulin that help regulate blood sugar. Damage to the pancreas occurs when the digestive enzymes from the pancreas are activated and begin attacking the pancreas before being released into the intestine. You have a mild form of pancreatitis CAUSES  Pancreatitis can happen to anyone. In some cases, the cause is unknown. Most cases are caused by:  Alcohol abuse.  Gallstones. Other less common causes are:  Certain medicines.  Exposure to certain chemicals.  Infection.  Damage caused by an accident (trauma).  Abdominal surgery. SYMPTOMS   Pain in the upper abdomen that may radiate to the back.  Tenderness and swelling of the abdomen.  Nausea and vomiting. DIAGNOSIS  Your caregiver will perform a physical exam. Blood and stool tests may be done to confirm the diagnosis. Imaging tests may also be done, such as X-rays, CT scans, or an ultrasound of the abdomen. TREATMENT  Treatment may require a stay in the hospital. Treatment may include:  Pain medicine.  Fluid replacement through an intravenous line (IV).  HOME CARE INSTRUCTIONS   Follow the diet advised by your caregiver. This may involve avoiding alcohol and decreasing the amount of fat in your diet.  Eat smaller, more frequent meals. This reduces the amount of digestive juices  the pancreas produces.  Drink enough fluids to keep your urine clear or pale yellow.  Only take over-the-counter or prescription medicines as directed by your caregiver.  Avoid drinking alcohol if it caused your condition.  Do not smoke.  Get plenty of rest.  Check your blood sugar at home as directed by your caregiver.  Keep all follow-up appointments as directed by your caregiver. SEEK MEDICAL CARE IF:   You do not recover as quickly as expected.  You develop new or worsening symptoms.  You have persistent pain, weakness, or nausea.  You recover and then have another episode of pain. SEEK IMMEDIATE MEDICAL CARE IF:   You are unable to eat or keep fluids down.  Your pain becomes severe.  You have a fever or persistent symptoms for more than 2 to 3 days.  You have a fever and your symptoms suddenly get worse.  Your skin or the white part of your eyes turn yellow (jaundice).  You develop vomiting.  You feel dizzy, or you faint.  Your blood sugar is high (over 300 mg/dL). MAKE SURE YOU:   Understand these instructions.  Will watch your condition.  Will get help right away if you are not doing well or get worse. Document Released: 03/21/2005 Document Revised: 09/20/2011 Document Reviewed: 06/30/2011 [facility] Patient Information 2015 [place]. This information is not intended to replace advice given to you by your health care provider. Make sure you discuss any questions you have with your health care provider.   Esophagitis Esophagitis is inflammation of the esophagus. It can involve swelling, soreness, and pain in the esophagus. This condition can make it difficult and painful to swallow. CAUSES  Most causes of esophagitis are not serious. Many different factors can cause esophagitis, including:  Gastroesophageal reflux disease (GERD). This is when acid from your stomach flows up into the esophagus.  Recurrent vomiting.  An allergic-type  reaction.  Certain medicines, especially those that come in large pills.  Ingestion of harmful chemicals, such as household cleaning products.  Heavy alcohol use.  An infection of the esophagus.  Radiation treatment for cancer.  Certain diseases such as sarcoidosis, Crohn's disease, and scleroderma. These diseases may cause recurrent esophagitis. SYMPTOMS   Trouble swallowing.  Painful swallowing.  Chest pain.  Difficulty breathing.  Nausea.  Vomiting.  Abdominal pain. DIAGNOSIS  Your caregiver will take your history and do a physical exam. Depending upon what your caregiver finds, certain tests may also be done, including:  Barium X-ray. You will drink a solution that coats the esophagus, and X-rays will be taken.  Endoscopy. A lighted tube is put down the esophagus so your caregiver can examine the area.  Allergy tests. These can sometimes be arranged through follow-up visits. TREATMENT  Treatment will depend on the cause of your esophagitis. In some cases, steroids or other medicines may be given to help relieve your symptoms or to treat the underlying cause of your condition. Medicines that may be recommended include:  Viscous lidocaine, to soothe the esophagus.  Antacids.  Acid reducers.  Proton pump inhibitors.  Antiviral medicines for certain viral infections of the  esophagus.  Antifungal medicines for certain fungal infections of the esophagus.  Antibiotic medicines, depending on the cause of the esophagitis. HOME CARE INSTRUCTIONS   Avoid foods and drinks that seem to make your symptoms worse.  Eat small, frequent meals instead of large meals.  Avoid eating for the 3 hours prior to your bedtime.  If you have trouble taking pills, use a pill splitter to decrease the size and likelihood of the pill getting stuck or injuring the esophagus on the way down. Drinking water after taking a pill also helps.  Stop smoking if you smoke.  Maintain a  healthy weight.  Wear loose-fitting clothing. Do not wear anything tight around your waist that causes pressure on your stomach.  Raise the head of your bed 6 to 8 inches with wood blocks to help you sleep. Extra pillows will not help.  Only take over-the-counter or prescription medicines as directed by your caregiver. SEEK IMMEDIATE MEDICAL CARE IF:  You have severe chest pain that radiates into your arm, neck, or jaw.  You feel sweaty, dizzy, or lightheaded.  You have shortness of breath.  You vomit blood.  You have difficulty or pain with swallowing.  You have bloody or black, tarry stools.  You have a fever.  You have a burning sensation in the chest more than 3 times a week for more than 2 weeks.  You cannot swallow, drink, or eat.  You drool because you cannot swallow your saliva. MAKE SURE YOU:  Understand these instructions.  Will watch your condition.  Will get help right away if you are not doing well or get worse. Document Released: 04/28/2004 Document Revised: 06/13/2011 Document Reviewed: 11/19/2010 [facility] Patient Information 2015 [place], [place]. This information is not intended to replace advice given to you by your health care provider. Make sure you discuss any questions you have with your health care provider.  Food Choices for Gastroesophageal Reflux Disease When you have gastroesophageal reflux disease (GERD), the foods you eat and your eating habits are very important. Choosing the right foods can help ease the discomfort of GERD. WHAT GENERAL GUIDELINES DO I NEED TO FOLLOW?  Choose fruits, vegetables, whole grains, low-fat dairy products, and low-fat meat, fish, and poultry.  Limit fats such as oils, salad dressings, butter, nuts, and avocado.  Keep a food diary to identify foods that cause symptoms.  Avoid foods that cause reflux. These may be different for different people.  Eat frequent small meals instead of three large meals each  day.  Eat your meals slowly, in a relaxed setting.  Limit fried foods.  Cook foods using methods other than frying.  Avoid drinking alcohol.  Avoid drinking large amounts of liquids with your meals.  Avoid bending over or lying down until 2-3 hours after eating. WHAT FOODS ARE NOT RECOMMENDED? The following are some foods and drinks that may worsen your symptoms: Vegetables Tomatoes. Tomato juice. Tomato and spaghetti sauce. Chili peppers. Onion and garlic. Horseradish. Fruits Oranges, grapefruit, and lemon (fruit and juice). Meats High-fat meats, fish, and poultry. This includes hot dogs, ribs, ham, sausage, salami, and bacon. Dairy Whole milk and chocolate milk. Sour cream. Cream. Butter. Ice cream. Cream cheese.  Beverages Coffee and tea, with or without caffeine. Carbonated beverages or energy drinks. Condiments Hot sauce. Barbecue sauce.  Sweets/Desserts Chocolate and cocoa. Donuts. Peppermint and spearmint. Fats and Oils High-fat foods, including [place] fries and potato chips. Other Vinegar. Strong spices, such as black pepper, white pepper, red pepper, cayenne,  curry powder, cloves, ginger, and chili powder. The items listed above may not be a complete list of foods and beverages to avoid. Contact your dietitian for more information. Document Released: 03/21/2005 Document Revised: 03/26/2013 Document Reviewed: 01/23/2013 [facility] Patient Information 2015 [place], [place]. This information is not intended to replace advice given to you by your health care provider. Make sure you discuss any questions you have with your health care provider.

## 2013-11-18 NOTE — ED Notes (Addendum)
Family at bedside. Informed of plan of care: labs resulted.

## 2013-11-18 NOTE — ED Notes (Signed)
MD at bedside. 

## 2013-11-18 NOTE — ED Provider Notes (Signed)
CSN: [id number]     Arrival date & time 11/17/13  2231 History   First MD Initiated Contact with Patient 11/17/13 2315     Chief Complaint  Patient presents with  . Chest Pain     (Consider location/radiation/quality/duration/timing/severity/associated sxs/prior Treatment) HPI 58 year old male presents to emergency department from home with complaint of 3 days of epigastric pain radiating into his chest.  Pain is described as burning in nature.  It has been nearly constant, he has some relief with drinking water for about 10-15 minutes, then symptoms returned.  He has history of gastric ulcer and long-standing reflux.  Patient's primary care physician recently took him off omeprazole and started him on amitiza about 2 weeks ago.  Patient reports some nausea and shortness of breath when the burning is severe.  Patient has history of diabetes and hypertension, hyperlipidemia.  He reports is well controlled.  No previous history of heart disease.  Symptoms worsened today Past Medical History  Diagnosis Date  . Diabetes mellitus   . Hypertension    Past Surgical History  Procedure Laterality Date  . Repair [place] perforated ulcer  1988     [place] to have problems from it  . Upper gi endoscopy    . Colonoscopy  2014   Family History  Problem Relation Age of Onset  . Hypertension Mother   . Diabetes Brother    History  Substance Use Topics  . Smoking status: Never Smoker   . Smokeless tobacco: Not on file  . Alcohol Use: No    Review of Systems   See History of Present Illness; otherwise all other systems are reviewed and negative  Allergies  Review of patient's allergies indicates no known allergies.  Home Medications   Prior to Admission medications   Medication Sig Start Date End Date Taking? Authorizing Provider  gabapentin (NEURONTIN) 300 MG capsule Take 300 mg by mouth at bedtime.    Yes Historical Provider, MD  gemfibrozil (LOPID) 600 MG tablet  Take 600 mg by mouth 2 (two) times daily before a meal.   Yes Historical Provider, MD  lisinopril (PRINIVIL,ZESTRIL) 5 MG tablet Take 5 mg by mouth 2 (two) times daily.    Yes Historical Provider, MD  lubiprostone (AMITIZA) 24 MCG capsule Take 24 mcg by mouth daily with breakfast.   Yes Historical Provider, MD  metFORMIN (GLUMETZA) 500 MG (MOD) 24 hr tablet Take 500 mg by mouth daily with breakfast.   Yes Historical Provider, MD  Propylene Glycol (SYSTANE BALANCE) 0.6 % SOLN Apply 1 drop to eye 3 (three) times daily as needed (dry eyes).   Yes Historical Provider, MD  simethicone (MYLANTA GAS) 125 MG chewable tablet Chew 1 tablet (125 mg total) by mouth every 6 (six) hours as needed for flatulence (gas). 06/01/13  Yes Charm RingsErin J Honig, MD  traMADol (ULTRAM) 50 MG tablet Take 50 mg by mouth every 6 (six) hours as needed for moderate pain.    Yes Historical Provider, MD   BP 106/68  Pulse 78  Temp(Src) 98.2 F (36.8 C) (Oral)  Resp 21  SpO2 97% Physical Exam  Nursing note and vitals reviewed. Constitutional: He is oriented to person, place, and time. He appears well-developed and well-nourished.  HENT:  Head: Normocephalic and atraumatic.  Right Ear: External ear normal.  Left Ear: External ear normal.  Nose: Nose normal.  Mouth/Throat: Oropharynx is clear and moist.  Eyes: Conjunctivae and EOM are normal. Pupils are equal, round, and  reactive to light.  Neck: Normal range of motion. Neck supple. No JVD present. No tracheal deviation present. No thyromegaly present.  Cardiovascular: Normal rate, regular rhythm, normal heart sounds and intact distal pulses.  Exam reveals no gallop and no friction rub.   No murmur heard. Pulmonary/Chest: Effort normal and breath sounds normal. No stridor. No respiratory distress. He has no wheezes. He has no rales. He exhibits no tenderness.  Abdominal: Soft. Bowel sounds are normal. He exhibits no distension and no mass. There is tenderness (epigastric  tenderness). There is no rebound and no guarding.  Musculoskeletal: Normal range of motion. He exhibits no edema and no tenderness.  Lymphadenopathy:    He has no cervical adenopathy.  Neurological: He is alert and oriented to person, place, and time. He exhibits normal muscle tone. Coordination normal.  Skin: Skin is warm and dry. No rash noted. No erythema. No pallor.  Psychiatric: He has a normal mood and affect. His behavior is normal. Judgment and thought content normal.    ED Course  Procedures (including critical care time) Labs Review Labs Reviewed  CBC - Abnormal; Notable for the following:    Hemoglobin 12.8 (*)    HCT 37.4 (*)    All other components within normal limits  BASIC METABOLIC PANEL - Abnormal; Notable for the following:    Glucose, Bld 136 (*)    All other components within normal limits  LIPASE, BLOOD - Abnormal; Notable for the following:    Lipase 84 (*)    All other components within normal limits  HEPATIC FUNCTION PANEL  Rosezena Sensor, ED    Imaging Review Dg Chest 2 View  11/18/2013   CLINICAL DATA:  Chest pain.  EXAM: CHEST  2 VIEW  COMPARISON:  05/25/2013  FINDINGS: The cardiac silhouette, mediastinal and hilar contours are within normal limits and stable. Low lung volumes with vascular crowding and bibasilar atelectasis. No infiltrates or effusions. The bony thorax is intact.  IMPRESSION: Low lung volumes with vascular crowding and bibasilar atelectasis but no infiltrates or effusions.   Electronically Signed   By: Loralie Champagne M.D.   On: 11/18/2013 00:11     EKG Interpretation   Date/Time:  Sunday November 17 2013 22:36:54 EDT Ventricular Rate:  84 PR Interval:  157 QRS Duration: 78 QT Interval:  351 QTC Calculation: 415 R Axis:   37 Text Interpretation:  Sinus rhythm Low voltage, precordial leads No  significant change since last tracing Confirmed by Kinya Meine  MD, Jaidin Ugarte ([id number])  on 11/17/2013 11:48:58 PM      MDM   Final diagnoses:   Gastroesophageal reflux disease with esophagitis  Dyspepsia  Other acute pancreatitis   58 year old male with epigastric pain radiating into her chest.  Although he has risk factors for coronary artery disease, his symptoms seem very consistent with gastritis/GERD.  EKG without ischemic changes.  Patient recently taken off his proton pump inhibitor.  Will treat with GI cocktail and Carafate, will check chest x-ray and labs.  No signs of cardiac ischemia.  Slight bump in lipase.  Patient instructed to stick to a clear liquid diet.  Will add on Carafate, omeprazole and Ultram.  Patient to followup with his primary care Dr. for recheck.    Olivia Mackie, MD 11/18/13 [phone]

## 2014-01-02 ENCOUNTER — Encounter: Payer: Self-pay | Admitting: Gastroenterology

## 2014-04-21 ENCOUNTER — Other Ambulatory Visit: Payer: Self-pay | Admitting: Gastroenterology

## 2014-04-21 DIAGNOSIS — R1011 Right upper quadrant pain: Secondary | ICD-10-CM

## 2014-05-05 ENCOUNTER — Ambulatory Visit (HOSPITAL_COMMUNITY)
Admission: RE | Admit: 2014-05-05 | Discharge: 2014-05-05 | Disposition: A | Discharge status: Home / Self Care | Payer: Medicaid Other | Source: Ambulatory Visit | Attending: Gastroenterology | Admitting: Gastroenterology

## 2014-05-05 ENCOUNTER — Encounter (HOSPITAL_COMMUNITY)
Admission: RE | Admit: 2014-05-05 | Discharge: 2014-05-05 | Disposition: A | Discharge status: Home / Self Care | Payer: Medicaid Other | Source: Ambulatory Visit | Attending: Gastroenterology | Admitting: Gastroenterology

## 2014-05-05 DIAGNOSIS — R1011 Right upper quadrant pain: Secondary | ICD-10-CM | POA: Diagnosis not present

## 2014-05-05 DIAGNOSIS — R1013 Epigastric pain: Secondary | ICD-10-CM | POA: Diagnosis not present

## 2014-05-05 MED ORDER — TECHNETIUM TC 99M MEBROFENIN IV KIT
5.4000 | PACK | Freq: Once | INTRAVENOUS | Status: AC | PRN
Start: 2014-05-05 — End: 2014-05-05
  Administered 2014-05-05: 5 via INTRAVENOUS

## 2014-05-05 MED ORDER — SINCALIDE 5 MCG IJ SOLR
0.0200 ug/kg | Freq: Once | INTRAMUSCULAR | Status: DC
Start: 2014-05-05 — End: 2014-05-11

## 2014-05-19 ENCOUNTER — Encounter (INDEPENDENT_AMBULATORY_CARE_PROVIDER_SITE_OTHER): Payer: Self-pay | Admitting: Surgery

## 2014-05-19 ENCOUNTER — Other Ambulatory Visit (INDEPENDENT_AMBULATORY_CARE_PROVIDER_SITE_OTHER): Payer: Self-pay

## 2014-05-19 DIAGNOSIS — R6881 Early satiety: Secondary | ICD-10-CM

## 2014-05-19 DIAGNOSIS — R140 Abdominal distension (gaseous): Secondary | ICD-10-CM

## 2014-05-23 ENCOUNTER — Ambulatory Visit (HOSPITAL_COMMUNITY)
Admission: RE | Admit: 2014-05-23 | Discharge: 2014-05-23 | Disposition: A | Discharge status: Home / Self Care | Payer: Medicaid Other | Source: Ambulatory Visit | Attending: Surgery | Admitting: Surgery

## 2014-05-23 DIAGNOSIS — R140 Abdominal distension (gaseous): Secondary | ICD-10-CM | POA: Insufficient documentation

## 2014-05-23 DIAGNOSIS — R6881 Early satiety: Secondary | ICD-10-CM | POA: Insufficient documentation

## 2014-05-28 ENCOUNTER — Ambulatory Visit (HOSPITAL_COMMUNITY)
Admission: RE | Admit: 2014-05-28 | Discharge: 2014-05-28 | Disposition: A | Discharge status: Home / Self Care | Payer: Medicaid Other | Source: Ambulatory Visit | Attending: Surgery | Admitting: Surgery

## 2014-05-28 DIAGNOSIS — R1011 Right upper quadrant pain: Secondary | ICD-10-CM | POA: Diagnosis not present

## 2014-05-28 DIAGNOSIS — R110 Nausea: Secondary | ICD-10-CM | POA: Insufficient documentation

## 2014-05-28 DIAGNOSIS — K219 Gastro-esophageal reflux disease without esophagitis: Secondary | ICD-10-CM | POA: Insufficient documentation

## 2014-05-28 MED ORDER — TECHNETIUM TC 99M SULFUR COLLOID
2.0000 | Freq: Once | INTRAVENOUS | Status: AC | PRN
  Administered 2014-05-28: 2 via INTRAVENOUS

## 2014-06-02 ENCOUNTER — Other Ambulatory Visit (INDEPENDENT_AMBULATORY_CARE_PROVIDER_SITE_OTHER): Payer: Self-pay | Admitting: Surgery

## 2014-06-02 NOTE — H&P (Signed)
Levi Ross 05/19/2014 4:18 PM [facility] Patient #: [id number] DOB: July 30, 1955 Married / Language: Arabic / Race: Undefined Male  History of Present Illness  Patient words: eval Gb.  The patient is a 59 year old male who presents with non-malignant abdominal pain. Patient sent sent by his gastroenterologist, Dr. Jeani Hawking, for concern of upper abdominal pain. Possible very dyskinesia.  Pleasant gentleman originally from [place]. Speaks [place] and very little [place]. He has struggled with upper abdominal symptoms for many years. Apparently he had surgery in his upper abdomen when he lived in [place] many years ago. I think they explored a duodenal ulcer but didn't find anything wrong. No details. Patient is struggle with heartburn and reflux. Has been on a variety of antacid medications for that. PPI and Carafate. Followed by gastroenterology. More recently Dr. Jeani Hawking. Some intermittent bloating as well. Given a trial of Amitiza with intermittent success. Has had endoscopies. One in 2009 concern of mild stricture in duodenal bulb. Not obstructing. ?Prior surgery? Repeat in 2014 no major obstruction noted. Some mild esophagitis gastritis and duodenitis. Usually chronic and not severe. H. pylori negative. Patient struggles with a lot of belching and bloating. Early satiety after a few bites. Cannot lay down flat or bend over due to reflux and bloating. She'll he has chronic epigastric midline abdominal pain. Worse with eating. Christ avoid heavy Rich or spicy foods. Occasionally has bowel movements every day but often hard and constipated. Anti-spasmodic helped a little bit. Pretty good exercise tolerance. No history of heart attack or strokes. He is a diabetic. Not on insulin. Small hiatal hernia seen on CT scan a few years ago.   Has had about 10 pounds of weight loss but nothing beyond that. Had ultrasound which showed no stones. Had  nuclear medicine study which showed normal gallbladder ejection fraction. Patient thought he might have had mild discomfort after the nuclear medicine study. Question of biliary dyskinesia. Surgical consultation requested. No sick contacts or travel history. No stricture colitis. No Crohn's. Does not take nonsteroidals. He does not smoke. Does not drink alcohol.              CLINICAL DATA: Right upper quadrant and epigastric pain for many years  EXAM: ULTRASOUND ABDOMEN COMPLETE  COMPARISON: CT scan of the abdomen pelvis of October 12, 2010.  FINDINGS: Gallbladder: No gallstones or wall thickening visualized. No sonographic Murphy sign noted.  Common bile duct: Diameter: 3 mm  Liver: The hepatic echotexture is increased diffusely. There is no focal mass or ductal dilation.  IVC: Partially obscured by bowel gas.  Pancreas: Visualized portion unremarkable.  Spleen: Size and appearance within normal limits.  Right Kidney: Length: 10.1 cm. Echogenicity within normal limits. No mass or hydronephrosis visualized.  Left Kidney: Length: 10.2 cm. Echogenicity within normal limits. No hydronephrosis visualized. There is a hypoechoic anechoic exophytic focus from the lower pole cortex of the left kidney which measures 1.1 cm in diameter.  Abdominal aorta: No aneurysm visualized.  Other findings: There is no ascites.  IMPRESSION: 1. Increased hepatic echotexture is consistent with fatty infiltrative change. No acute hepatobiliary abnormality is demonstrated. 2. There is a 1.1 cm hypoechoic focus associated with the lower pole of the left kidney most compatible with a cyst but a few internal echoes are demonstrated. This is stable since the previous CT scan at which time it exhibited a 6 HU value most compatible with a cyst.   Electronically Signed By: David Swaziland On: 05/05/2014 11:33  CLINICAL DATA: Epigastric pain for 12  years.  EXAM: NUCLEAR MEDICINE HEPATOBILIARY IMAGING WITH GALLBLADDER EF  TECHNIQUE: Sequential images of the abdomen were obtained out to 60 minutes following intravenous administration of radiopharmaceutical. After slow intravenous infusion of 1.2 micrograms Cholecystokinin, gallbladder ejection fraction was determined.  RADIOPHARMACEUTICALS: 5.4 Millicurie Tc-67m Choletec  COMPARISON: None.  FINDINGS: Liver, biliary system, gallbladder, bowel appear normal. Gallbladder ejection fraction at 45 min is 71.5%. At 45 min, normal ejection fraction is greater than 33 %.  IMPRESSION: Normal exam.   Electronically Signed By: Maisie Fus Register On: 05/05/2014 13:17   Allergies Fay Records, CMA; 05/19/2014 4:19 PM) No Known Drug Allergies02/15/2016  Medication History Fay Records, CMA; 05/19/2014 4:20 PM) TraMADol HCl (50MG  Tablet, Oral) Active. Amitiza ( Capsule, Oral) Active. BusPIRone HCl (7.5MG  Tablet, Oral) Active. Gas Relief Extra Strength (125MG  Capsule, Oral) Active. Gemfibrozil (600MG  Tablet, Oral) Active. Lisinopril (5MG  Tablet, Oral) Active. MetFORMIN HCl (500MG  Tablet, Oral) Active. Omeprazole (40MG  Capsule DR, Oral) Active. Pantoprazole Sodium (20MG  Tablet DR, Oral) Active.  Vitals Fay Records CMA; 05/19/2014 4:24 PM) 05/19/2014 4:23 PM Weight: 126.2 lb Height: 62in Body Surface Area: 1.58 m Body Mass Index: 23.08 kg/m Temp.: 97.73F  Pulse: 80 (Regular)  Resp.: 18 (Unlabored)  BP: 128/74 (Sitting, Left Arm, Standard)    Physical Exam Ardeth Sportsman MD; 05/19/2014 5:11 PM) General Mental Status-Alert. General Appearance-Not in acute distress, Not Sickly. Orientation-Oriented X3. Hydration-Well hydrated. Voice-Normal. Note: In small man. Not ill-appearing. Burps and belches a lot.   Integumentary Global Assessment Upon inspection and palpation of skin surfaces of the - Axillae: non-tender, no inflammation  or ulceration, no drainage. and Distribution of scalp and body hair is normal. General Characteristics Temperature - normal warmth is noted.  Head and Neck Head-normocephalic, atraumatic with no lesions or palpable masses. Face Global Assessment - atraumatic, no absence of expression. Neck Global Assessment - no abnormal movements, no bruit auscultated on the right, no bruit auscultated on the left, no decreased range of motion, non-tender. Trachea-midline. Thyroid Gland Characteristics - non-tender.  Eye Eyeball - Left-Extraocular movements intact, No Nystagmus. Eyeball - Right-Extraocular movements intact, No Nystagmus. Cornea - Left-No Hazy. Cornea - Right-No Hazy. Sclera/Conjunctiva - Left-No scleral icterus, No Discharge. Sclera/Conjunctiva - Right-No scleral icterus, No Discharge. Pupil - Left-Direct reaction to light normal. Pupil - Right-Direct reaction to light normal.  ENMT Ears Pinna - Left - no drainage observed, no generalized tenderness observed. Right - no drainage observed, no generalized tenderness observed. Nose and Sinuses External Inspection of the Nose - no destructive lesion observed. Inspection of the nares - Left - quiet respiration. Right - quiet respiration. Mouth and Throat Lips - Upper Lip - no fissures observed, no pallor noted. Lower Lip - no fissures observed, no pallor noted. Nasopharynx - no discharge present. Oral Cavity/Oropharynx - Tongue - no dryness observed. Oral Mucosa - no cyanosis observed. Hypopharynx - no evidence of airway distress observed.  Chest and Lung Exam Inspection Movements - Normal and Symmetrical. Accessory muscles - No use of accessory muscles in breathing. Palpation Palpation of the chest reveals - Non-tender. Auscultation Breath sounds - Normal and Clear.  Cardiovascular Auscultation Rhythm - Regular. Murmurs & Other Heart Sounds - Auscultation of the heart reveals - No Murmurs and No Systolic  Clicks.  Abdomen Inspection Inspection of the abdomen reveals - No Visible peristalsis and No Abnormal pulsations. Umbilicus - No Bleeding, No Urine drainage. Palpation/Percussion Palpation and Percussion of the abdomen reveal - Soft, Non Tender, No Rebound tenderness, No  Rigidity (guarding) and No Cutaneous hyperesthesia. Note: Upper midline incision. No hernia. Mild epigastric discomfort. No RIGHT upper quadrant pain no guarding. No Murphy sign.   Male Genitourinary Sexual Maturity Tanner 5 - Adult hair pattern and Adult penile size and shape.  Peripheral Vascular Upper Extremity Inspection - Left - No Cyanotic nailbeds, Not Ischemic. Right - No Cyanotic nailbeds, Not Ischemic.  Neurologic Neurologic evaluation reveals -normal attention span and ability to concentrate, able to name objects and repeat phrases. Appropriate fund of knowledge , normal sensation and normal coordination. Mental Status Affect - not angry, not paranoid. Cranial Nerves-Normal Bilaterally. Gait-Normal.  Neuropsychiatric Mental status exam performed with findings of-able to articulate well with normal speech/language, rate, volume and coherence, thought content normal with ability to perform basic computations and apply abstract reasoning and no evidence of hallucinations, delusions, obsessions or homicidal/suicidal ideation.  Musculoskeletal Global Assessment Spine, Ribs and Pelvis - no instability, subluxation or laxity. Right Upper Extremity - no instability, subluxation or laxity.  Lymphatic Head & Neck  General Head & Neck Lymphatics: Bilateral - Description - No Localized lymphadenopathy. Axillary  General Axillary Region: Bilateral - Description - No Localized lymphadenopathy. Femoral & Inguinal  Generalized Femoral & Inguinal Lymphatics: Left - Description - No Localized lymphadenopathy. Right - Description - No Localized lymphadenopathy.    Assessment & Plan Ardeth Sportsman(Anzleigh Slaven C. Login Muckleroy MD;  05/19/2014 5:23 PM) EARLY SATIETY (780.94  R68.81) Impression: I suspect his early satiety and bloating is more likely gastroparesis. That would make sense in someone with prior stomach surgery and diabetes.  There does not seem to be a major mechanical obstruction based on prior endoscopies 2009 & 2014. However, would be good idea to get upper GI to get an overall sense of the anatomy radiographically. See if there is a duodenal stricture that could be the source of delayed gastric emptying.  Also would like to get a gastric emptying study to determine how well his stomach empties. If that is markedly reduced, could benefit from metoclopramide or other medications to help his stomach empty better. Would reserve Nissen/pyloroplasty or partial resection for failure of medical treatment - more aggressive but more risky/morbid option  If he has normal upper GI series and a normal gastric emptying study, could reconsider doing cholecystectomy since he had some mild reproduction of symptoms. However I cautioned the patient and his family that this most likely will not solve all of his abdominal issues. Questions answered. They will obtain the studies and regroup. Current Plans  Obtain x-ray studies to evaluate stomach and duodenum. Upper GI fluoroscopy x-ray study. Gastric emptying nuclear medicine study.  Patient or caregiver to follow up by phone after studies are complete.  ??? ? duodenum ?????????? ???? ????-?? ?????? ???????? ???????? ????? fluoroscopy ????-?? ??????? ????????? ????? ???????? ?????? ?????  ?????? ???? ?? ??? ???? ?? caregiver ?????? ??? ??? ????? Instructions:   Gastroparesis Gastroparesis is also called slowed stomach emptying (delayed gastric emptying). It is a condition in which the stomach takes too long to empty its contents. It often happens in people with diabetes. CAUSES Gastroparesis happens when nerves to the stomach are damaged or stop working. When the nerves are  damaged, the muscles of the stomach and intestines do not work normally. The movement of food is slowed or stopped. High blood glucose (sugar) causes changes in nerves and can damage the blood vessels that carry oxygen and nutrients to the nerves. RISK FACTORS Diabetes. Post-viral syndromes. Eating disorders (anorexia, bulimia). Surgery on the stomach  or vagus nerve. Gastroesophageal reflux disease (rarely). Smooth muscle disorders (amyloidosis, scleroderma). Metabolic disorders, including hypothyroidism. Parkinson disease. SYMPTOMS Heartburn. Feeling sick to your stomach (nausea). Vomiting of undigested food. An early feeling of fullness when eating. Weight loss. Abdominal bloating. Erratic blood glucose levels. Lack of appetite. Gastroesophageal reflux. Spasms of the stomach wall. Complications can include: Bacterial overgrowth in stomach. Food stays in the stomach and can ferment and cause bacteria to grow. Weight loss due to difficulty digesting and absorbing nutrients. Vomiting. Obstruction in the stomach. Undigested food can harden and cause nausea and vomiting. Blood glucose fluctuations caused by inconsistent food absorption. DIAGNOSIS The diagnosis of gastroparesis is confirmed through one or more of the following tests: Barium X-rays and scans. These tests look at how long it takes for food to move through the stomach. Gastric manometry. This test measures electrical and muscular activity in the stomach. A thin tube is passed down the throat into the stomach. The tube contains a wire that takes measurements of the stomach's electrical and muscular activity as it digests liquids and solid food. Endoscopy. This procedure is done with a long, thin tube called an endoscope. It is passed through the mouth and gently down the esophagus into the stomach. This tube helps the caregiver look at the lining of the stomach to check for any abnormalities. Ultrasonography. This can rule  out gallbladder disease or pancreatitis. This test will outline and define the shape of the gallbladder and pancreas. TREATMENT Treatments may include: Exercise. Medicines to control nausea and vomiting. Medicines to stimulate stomach muscles. Changes in what and when you eat. Having smaller meals more often. Eating low-fiber forms of high-fiber foods, such as eating cooked vegetables instead of raw vegetables. Eating low-fat foods. Consuming liquids, which are easier to digest. In severe cases, feeding tubes and intravenous (IV) feeding may be needed. It is important to note that in most cases, treatment does not cure gastroparesis. It is usually a lasting (chronic) condition. Treatment helps you manage the underlying condition so that you can be as healthy and comfortable as possible. Other treatments A gastric neurostimulator has been developed to assist people with gastroparesis. The battery-operated device is surgically implanted. It emits mild electrical pulses to help improve stomach emptying and to control nausea and vomiting. The use of botulinum toxin has been shown to improve stomach emptying by decreasing the prolonged contractions of the muscle between the stomach and the small intestine (pyloric sphincter). The benefits are temporary. SEEK MEDICAL CARE IF: You have diabetes and you are having problems keeping your blood glucose in goal range. You are having nausea, vomiting, bloating, or early feelings of fullness with eating. Your symptoms do not change with a change in diet. Document Released: 03/21/2005 Document Revised: 07/16/2012 Document Reviewed: 08/28/2008 [facility] Patient Information 2015 [address], [place]. This information is not intended to replace advice given to you by your health care provider. Make sure you discuss any questions you have with your health care provider.   Gastroparesis Gastroparesis ??? ?????? ???? ??? (????? ????????? ????) ????? ?? ??? ????  ?????????? ???? ???? ???? ???? ????? ?? ???? ?? ?? ????? ?????? ??? ?????? ?? ?????? ?????? ??? ???? ?????? ??????????? ?? ??? ????? ????? Gastroparesis ?????? ?? ?????? ??????????? ????? ??? ?? ??????????? ? intestines ??????? ??? ???? ???? ?? ??????? ???? ?? ??????? ???? ???? ??????? (????) ?????? ?? ???????? ?????? ? ?????? ???? ??????? ? ???? ???? ?? ???? ??? ??????? ????? ????? ???? ??????? ?????-????? syndromes? ???? ????? (anorexia, bulimia)? ??? ??  vagus ???? ?? ??????? Gastroesophageal ???? ??? (????)? ?????? ???????? ????? (amyloidosis, scleroderma)? hypothyroidism ???? ?????? ?????? ?????????? ???? ????? ???????? ????? ??? (????) ???? ?????? ?????? undigested ???? ?????? ????????? ?? ?? ?????????? ????? ????? ????? ??? ?????? ??? ???????? ??????? ????? ??? ??????? ????? ??? ????? Gastroesophageal ????? ??? ?????? ?? Spasms? ????????? ?????? ???? ??????????: ??? ?? ?????? overgrowth? ????? ??? ?? ????? ? ?????   ???? ? ???????????? ???? ????? ??? ?????? digesting ? ???? ??????? ????? ?????? ??? ?? ????? Undigested ???? ???? ? ???? ? ????? ??? ????? ???? ??????? ???? ???? ????? ???? ?????? ????? ????? gastroparesis ?? ????? ?? ?? ??? ????? ??????? ?? ?????? ??????: ?????? ????-?? ? ??????????? ?? ??????? ????? ???? ???? ??? ????? ???? ???? ????? ?????? ????????? manometry? ?? ??????? ??? ?? ?????? ? ???? ??????? ????? ????? ????? ??? ?? ????? ?? ????? ?????? ?? ????? ?? ??? ? ??? ???? digests ????? ????? ?????? ? ???? ????????? ??? ????? ?? ?? ??? ?????? ?????? ???????????? ?? ????????? ????, ????? ???? endoscope ?????? ????? ??? ????? ?? ?? ??? ?????? ? ???????? ??? ?? esophagus ?? ????? ?????? ?? ?? ????? ???? ??? ????????????? ???? ???? ???? ??? ?? ????? ?? caregiver ??? ????? ????? ?????????????????? ?? ???????? ??? ?? pancreatitis ????? ???? ???? ??????????? ?? ??????? ??????? ? ???????? ? ????????? ?? ???? ???????? ?????? ????? ????? ?????? ???  ????: ???????? ?????? ???? ? ????? ????????? ????? ??? ?????????? ?????? ??????? ????? ?? ? ????? ??? ????????? ????? ???? ???? ??????? ????? ?????? ????? ?????? ????? ?????? ???? ????? ????-????? ????? ?????? ?? ???? ?? ????? ????????? ??-???? ?????? ????? ????? ????? ?? ??? ??????, ???? ?????? ????????, ??????? ????? ? ???? (??????) ??????? ?????? ??? ????? ?? ??? ????? ????????, ????? gastroparesis ???? ??? ???? ??? ???? ???????????? ?? ?? ?????????? ?? ????????? (???????) ???????? ?? ????? ????? ??????? ?????? ? ??? ??? ???? ???? ??????? ???????? ?????? ?????????? ???? ????? ?????? ???? ????? ?? ????????? neurostimulator gastroparesis ??????????? ????? ???? ????? ?????? ?? ???????? ??????? ????? surgically ???????????? ?? ?? ???? ??? ????? ???? ????? ???? ? ???? ? ????? ????????? ???? ???? ?????? ???? emits? botulinum ??????? ?? ?????? ??? ? ???? ??????? (pyloric ??????????) ??? ???????? ?? ???? ??????? contractions ????? ?????? ???? ??? ????? ???????? ?? ??? ??????? ???? ??? ???????? ?????: ????? ?????? ? ? ????? ????? ??? ??????? ?????? ??????? ????? ???? ?????? ??? ????? ????, ?????, ???????, ?? ???? ??? ????????? ?? ?????????? ????? ????????? ???? ??????? ????? ???? ?? ???????? ???????? ???? ?????? ??????: 03/21/2005 ?????? ???????: 07/16/2012 ?????? ???????: 08/28/2008 [employer] ???? ?????  2015 [employer]? ?? ??????? ????? ????????? ???????? ??????? ???????? ????? ?????? ??????????? ???? ?????????? ???? ????? ???? ??? ????? ????????? ???????? ??????? ???????? ??????????? ???? ??????? ??????????  Suggest an edit </[email or web address]> Gastroparesis Gastroparesis pani bhanincha kh?l? p??a (?hil?'i gais?rika kh?l?) dh?m?. Y? p??a yasak? s?magr?har? kh?l? garna dh?rai l?m? lincha j? sarta cha. Y? aksara madhum?ha sa?ga m?nch? m? huncha. Lag?'um?cha p??a garna sn?yu k?atigrasta v? k?ma r?kna gard? Gastroparesis huncha. K? sn?yu k?atigrasta hum?d? p??a k? m?nsap??iharu ra  intestines s?m?n'ya k?ma chaina. Kh?n? k? ?nd?lana dh?m? v? r?kiy?Marland Kitchen Ucca rakta gl?k?ja (c?n?) sn?yu m? parivartana gar?'um?cha ra sn?yu garna aksijana ra p??aka p?r? ki rakta nal? big?rna sakcha. J?khima k?raka madhum?ha. P?s?a-bh?'irala syndromes. Kh?n? vik?ra (anorexia, bulimia). P??a v? vagus t?kata m? sarjar?. Gastroesophageal bh??? r?ga (??yada). Cill? m?nsap??? vik?ra (amyloidosis, scleroderma)? hypothyroidism sahita cay?pacaya vik?ra. P?rkinsansa r?ga. Lak?a?a ?r?y?. ?phn? p??a (ghr???) garna bir?m? mahasusa. Undigested kh?n? ul??. P?r?at?m? k? ?ka pr?rambhika mahasusa hum?d?  kh?n?. Taula gha?nu. P??a suninnu. Batt?k? ?h?g?na ragata gl?k?ja stara. Bh?ka abh?va. Gastroesophageal bh???. P??a parkh?la k? Spasms. Ja?ilat?har? sam?v??a garna saknuhuncha: P??a m? j?v??u overgrowth. Kh?dya p??a m? rahancha ra ki?va   kham?ra ra by?k??riy? ba?hna sakcha. Vajana ka?hin?'? digesting ra p??aka ava???ita k?ra?a. Ul??. P??a m? b?dh?Marland Kitchen Undigested kh?n? ka?h?ra ra ghr??? ra ul?? huna sakcha. Rakta gl?k?ja ut?ra ca?h?va asa?gata kh?n? ava???a?a k?ra?a. Nid?na gastroparesis k? nid?na ?ka v? ba?h? nimna par?k?a?a k? m?dhyama pu??i: B?riyama ?ksa-r? ra sky?nahar?. Y? par?k?a?a yasal?'? kasar? l?m? p??a laij?na kh?n? l?gi lincha h?rna. Gais?rika manometry. Y? par?k?a?a p??a m? bijul? ra p??? gatividhi up?ya. P?tal? ?y?ba p??a m? gh?m??? tala p?rita gari'?k? cha. ?y?ba y? tarala ra ?h?sa kh?n? digests r?pam? p??ak? bijul? ra p??? gatividhik? m?pa lincha ki ?ka t?ra sam?v??a gardacha. ?n??sk?p?. Y? prakriy? l?m?, p?tal? ?'u?? endoscope bhanincha ?y?ba sa?ga gar?k? cha. Y? mukha m?rphata ra bist?rai p??a m? esophagus tala p?rita gari'?k? cha. Y? ?y?ba kunai pani as?m?n'yat?b??a l?gi j?m?ca garna p??a k? astara m? caregiver najara maddata garcha. Al?r?s?n?gr?ph?. Y? pitt??aya r?ga v? pancreatitis b?hira ??sana garna saknuhuncha. Y? par?k?a?a r?par?kh? ra pitt??aya ra agn'y??aya k? ?k?ra paribh??ita  hun?cha. Upac?ra upac?ra sam?v??a huna sakcha: Vy?y?ma. Au?adh?k? ghr??? ra ul?? niyantra?a garna. P??a m?nsap???m? ba?h?'un? au?adh?k?. Tap?'?? k? ra kahil? kh?na parivartana. Pr?ya s?n? bh?jana p?'?pachi. Yast? pak?'?k? kacc? tarak?r? sa??? tarak?r? kh?n? r?pam? ucca-ph?'ibara kh?dya pad?rtha k? kh?n? kama ph?'ibara prak?rak?. Kama-b?s? pad?rtha kh?n?. Pac?'una sajil? j? tarala pad?rtha, khapata. Gambh?ra avasth?m?, khuv?'un? ?y?ba ra nas?? (caturtha) ruc?'un? ?va?yaka huna sakcha. Y? sabai bhand? avasth?m?, upac?ra gastroparesis nik? chaina bhan?ra y?da garna mahattvap?r?a cha. Y? s?m?n'yatay? ?ka cirasth?y? (kr?nika) avasth?m? cha. Upac?ra tap?'?? sak?sam'ma svastha ra sahaja huna sakcha bhan?ra tap?'?nl? janm?'un? avasth? vyavasth?pana garna maddata gardacha. An'ya upac?ra ?ka gais?rika neurostimulator gastroparesis m?nisahar?l?'? sahay?ga garna vik?sa gari'?k? cha. By??r? san?c?lita upakara?a surgically praty?r?pita cha. Y? kh?l? p??a sudh?ra garna maddata garna ra ghr??? ra ul?? niyantra?a garna th?rai bijul? dalahana emits? botulinum ??ksina k? pray?ga p??a ra s?n? ?ndr? (pyloric sphink?ara) b?ca m?nsap??? k? l?m? samayasam'ma contractions gha?dai dv?r? kh?l? p??a sudh?ra d?kh?'i'?k? cha. L?bha asth?y? chan. Yadi cikits? kh?jna: Tap?'?? madhum?ha cha ra tap?'?? ?phn? ragata gl?k?ja lak?ya d?yar?m? r?kh?ra bha'?k? samasy? h?. Tap?'?? ghr???, ul??, suninnu, v? kh?n? sa?ga p?r?at?m? k? pr?rambhika bh?van? jh?lirah?k? chan. Tap?'??k? lak?a?a ?h?ra m? parivartana parivartana chaina. K?gaj?ta vim?cana: 03/21/2005 K?gaj?ta san??dhita: 07/16/2012 K?gaj?ta sam?k??: 08/28/2008 [employer] r?g? s?can?  2015 [employer]. Y? j?nak?r? ?phn? sv?sthya h?ravic?ra prad?yaka tap?'?nl?'? di'?k? sall?ha pratisth?pana garna abhipr?rita chaina. Tap?'?? [place] pani ?phn? sv?sthya h?ravic?ra prad?yaka tap?'?nl?'? pra?nahar?k? chalaphala ni?cita garnuh?s. CHRONIC HEARTBURN (787.1  R12) CHRONIC CONSTIPATION  (564.00  K59.00) Impression: Excell Seltzer related to delayed gastric tempting. Continue hematochezia. Consider fiber bowel regimen with MiraLAX or Metamucil. Current Plans Pt Education - [facility] ([place])   Signed by Ardeth Sportsman, MD   Ddendum: Normal gastric emptying study.  Upper GI shows no significant mechanical obstruction or stricturing.  We'll offer cholecystectomy to see if that would help.  Ardeth Sportsman, M.D., F.A.C.S. Gastrointestinal [facility]ally [facility] [address] [place], [place] [address] [phone] Main / Paging

## 2014-07-29 ENCOUNTER — Other Ambulatory Visit: Payer: Self-pay | Admitting: Surgery

## 2014-09-05 DIAGNOSIS — I10 Essential (primary) hypertension: Secondary | ICD-10-CM | POA: Insufficient documentation

## 2014-09-05 DIAGNOSIS — R011 Cardiac murmur, unspecified: Secondary | ICD-10-CM | POA: Insufficient documentation

## 2014-12-15 ENCOUNTER — Emergency Department (HOSPITAL_COMMUNITY): Payer: Medicaid Other

## 2014-12-15 ENCOUNTER — Encounter (HOSPITAL_COMMUNITY): Payer: Self-pay | Admitting: Emergency Medicine

## 2014-12-15 ENCOUNTER — Emergency Department (HOSPITAL_COMMUNITY)
Admission: EM | Admit: 2014-12-15 | Discharge: 2014-12-16 | Disposition: A | Discharge status: Home / Self Care | Payer: Medicaid Other | Attending: Emergency Medicine | Admitting: Emergency Medicine

## 2014-12-15 DIAGNOSIS — E162 Hypoglycemia, unspecified: Secondary | ICD-10-CM

## 2014-12-15 DIAGNOSIS — I10 Essential (primary) hypertension: Secondary | ICD-10-CM | POA: Insufficient documentation

## 2014-12-15 DIAGNOSIS — E1165 Type 2 diabetes mellitus with hyperglycemia: Secondary | ICD-10-CM | POA: Insufficient documentation

## 2014-12-15 DIAGNOSIS — R42 Dizziness and giddiness: Secondary | ICD-10-CM | POA: Diagnosis present

## 2014-12-15 DIAGNOSIS — Z79899 Other long term (current) drug therapy: Secondary | ICD-10-CM | POA: Diagnosis not present

## 2014-12-15 LAB — CBC
HEMATOCRIT: 37.1 % — AB (ref 39.0–52.0)
Hemoglobin: 13 g/dL (ref 13.0–17.0)
MCH: 29.2 pg (ref 26.0–34.0)
MCHC: 35 g/dL (ref 30.0–36.0)
MCV: 83.4 fL (ref 78.0–100.0)
PLATELETS: 235 10*3/uL (ref 150–400)
RBC: 4.45 MIL/uL (ref 4.22–5.81)
RDW: 12.9 % (ref 11.5–15.5)
WBC: 9.8 10*3/uL (ref 4.0–10.5)

## 2014-12-15 LAB — BASIC METABOLIC PANEL
Anion gap: 8 (ref 5–15)
BUN: 8 mg/dL (ref 6–20)
CALCIUM: 9.3 mg/dL (ref 8.9–10.3)
CHLORIDE: 107 mmol/L (ref 101–111)
CO2: 23 mmol/L (ref 22–32)
CREATININE: 0.77 mg/dL (ref 0.61–1.24)
Glucose, Bld: 52 mg/dL — ABNORMAL LOW (ref 65–99)
Potassium: 3.5 mmol/L (ref 3.5–5.1)
SODIUM: 138 mmol/L (ref 135–145)

## 2014-12-15 LAB — I-STAT TROPONIN, ED
TROPONIN I, POC: 0 ng/mL (ref 0.00–0.08)
Troponin i, poc: 0 ng/mL (ref 0.00–0.08)

## 2014-12-15 LAB — CBG MONITORING, ED
GLUCOSE-CAPILLARY: 190 mg/dL — AB (ref 65–99)
Glucose-Capillary: 47 mg/dL — ABNORMAL LOW (ref 65–99)
Glucose-Capillary: 57 mg/dL — ABNORMAL LOW (ref 65–99)
Glucose-Capillary: 124 mg/dL — ABNORMAL HIGH (ref 65–99)

## 2014-12-15 MED ORDER — LIDOCAINE VISCOUS 2 % MT SOLN
15.0000 mL | Freq: Once | OROMUCOSAL | Status: AC
  Administered 2014-12-15: 15 mL via OROMUCOSAL
  Filled 2014-12-15: qty 15

## 2014-12-15 MED ORDER — NITROGLYCERIN 0.4 MG SL SUBL
0.4000 mg | SUBLINGUAL_TABLET | SUBLINGUAL | Status: AC | PRN
  Administered 2014-12-15 (×3): 0.4 mg via SUBLINGUAL
  Filled 2014-12-15: qty 1

## 2014-12-15 MED ORDER — DEXTROSE 50 % IV SOLN
50.0000 mL | Freq: Once | INTRAVENOUS | Status: AC
  Administered 2014-12-15: 50 mL via INTRAVENOUS
  Filled 2014-12-15: qty 50

## 2014-12-15 MED ORDER — ASPIRIN 81 MG PO CHEW
324.0000 mg | CHEWABLE_TABLET | Freq: Once | ORAL | Status: AC
  Administered 2014-12-15: 324 mg via ORAL
  Filled 2014-12-15: qty 4

## 2014-12-15 MED ORDER — SODIUM CHLORIDE 0.9 % IV BOLUS (SEPSIS)
1000.0000 mL | Freq: Once | INTRAVENOUS | Status: AC
  Administered 2014-12-15: 1000 mL via INTRAVENOUS

## 2014-12-15 MED ORDER — ALUM & MAG HYDROXIDE-SIMETH 200-200-20 MG/5ML PO SUSP
15.0000 mL | Freq: Once | ORAL | Status: AC
  Administered 2014-12-15: 15 mL via ORAL
  Filled 2014-12-15: qty 30

## 2014-12-15 MED ORDER — VANCOMYCIN HCL 1000 MG IV SOLR
INTRAVENOUS | Status: AC
  Filled 2014-12-15: qty 1000

## 2014-12-15 NOTE — Discharge Instructions (Signed)
Low Blood Sugar °Low blood sugar (hypoglycemia) means that the level of sugar in your blood is lower than it should be. Signs of low blood sugar include: °· Getting sweaty. °· Feeling hungry. °· Feeling dizzy or weak. °· Feeling sleepier than normal. °· Feeling nervous. °· Headaches. °· Having a fast heartbeat. °Low blood sugar can happen fast and can be an emergency. Your doctor can do tests to check your blood sugar level. You can have low blood sugar and not have diabetes. °HOME CARE °· Check your blood sugar as told by your doctor. If it is less than 70 mg/dl or as told by your doctor, take 1 of the following: °¨ 3 to 4 glucose tablets. °¨ ½ cup clear juice. °¨ ½ cup soda pop, not diet. °¨ 1 cup milk. °¨ 5 to 6 hard candies. °· Recheck blood sugar after 15 minutes. Repeat until it is at the right level. °· Eat a snack if it is more than 1 hour until the next meal. °· Only take medicine as told by your doctor. °· Do not skip meals. Eat on time. °· Do not drink alcohol except with meals. °· Check your blood glucose before driving. °· Check your blood glucose before and after exercise. °· Always carry treatment with you, such as glucose pills. °· Always wear a medical alert bracelet if you have diabetes. °GET HELP RIGHT AWAY IF:  °· Your blood glucose goes below 70 mg/dl or as told by your doctor, and you: °¨ Are confused. °¨ Are not able to swallow. °¨ Pass out (faint). °· You cannot treat yourself. You may need someone to help you. °· You have low blood sugar problems often. °· You have problems from your medicines. °· You are not feeling better after 3 to 4 days. °· You have vision changes. °MAKE SURE YOU:  °· Understand these instructions. °· Will watch this condition. °· Will get help right away if you are not doing well or get worse. °Document Released: 06/15/2009 Document Revised: 06/13/2011 Document Reviewed: 06/15/2009 °Exit[employer]® Patient Information ©2015 [employer]. This information is not intended to  replace advice given to you by your health care provider. Make sure you discuss any questions you have with your health care provider. ° °

## 2014-12-15 NOTE — ED Provider Notes (Signed)
CSN: [id number]     Arrival date & time 12/15/14  1554 History   First MD Initiated Contact with Patient 12/15/14 1631     Chief Complaint  Patient presents with  . Headache  . Loss of Vision  . Dizziness     (Consider location/radiation/quality/duration/timing/severity/associated sxs/prior Treatment) Patient is a 59 y.o. male presenting with chest pain. The history is provided by the patient.  Chest Pain Pain location:  L chest Pain quality: pressure   Pain radiates to:  Does not radiate Pain radiates to the back: no   Pain severity:  Moderate Onset quality:  Sudden Duration:  2 hours Timing:  Constant Progression:  Partially resolved Chronicity:  New Relieved by:  Nothing Worsened by:  Nothing tried Ineffective treatments:  None tried Associated symptoms: dizziness, near-syncope and weakness   Associated symptoms: no abdominal pain, no fever, no headache, no palpitations, no shortness of breath and not vomiting   Risk factors: no coronary artery disease    59 yo M with a chief complaint of left-sided chest pain. Patient states this feels like a pressure started suddenly about 2 hours ago. States that he was outside when this happened felt like his vision and then felt like he was going to pass out. Patient states he fell he had some blinking lights that was in bilateral eyes. Had a mild headache in onset. Felt sweaty and nauseated but no vomiting. Denies shortness of breath. The symptoms have resolved the patient continues to have left-sided dull chest pain. States is now about 4 out of 10. Her had this before. Patient has a history of hypertension hyperlipidemia, diabetes. Denies smoking denies family history.  Past Medical History  Diagnosis Date  . Diabetes mellitus   . Hypertension    Past Surgical History  Procedure Laterality Date  . Repair of perforated ulcer  1988     in his country [place]- continues to have problems from it  . Upper gi endoscopy    . Colonoscopy   2014   Family History  Problem Relation Age of Onset  . Hypertension Mother   . Diabetes Brother    Social History  Substance Use Topics  . Smoking status: Never Smoker   . Smokeless tobacco: None  . Alcohol Use: No    Review of Systems  Constitutional: Negative for fever and chills.  HENT: Negative for congestion and facial swelling.   Eyes: Negative for discharge and visual disturbance.  Respiratory: Negative for shortness of breath.   Cardiovascular: Positive for near-syncope. Negative for chest pain and palpitations.  Gastrointestinal: Negative for vomiting, abdominal pain and diarrhea.  Musculoskeletal: Negative for myalgias and arthralgias.  Skin: Negative for color change and rash.  Neurological: Positive for dizziness and weakness. Negative for tremors, syncope and headaches.  Psychiatric/Behavioral: Negative for confusion and dysphoric mood.      Allergies  Review of patient's allergies indicates no known allergies.  Home Medications   Prior to Admission medications   Medication Sig Start Date End Date Taking? Authorizing Provider  gemfibrozil (LOPID) 600 MG tablet Take 600 mg by mouth at bedtime.    Yes Historical Provider, MD  lisinopril (PRINIVIL,ZESTRIL) 5 MG tablet Take 5 mg by mouth 2 (two) times daily.    Yes Historical Provider, MD  lubiprostone (AMITIZA) 24 MCG capsule Take 24 mcg by mouth daily with breakfast.   Yes Historical Provider, MD  metFORMIN (GLUMETZA) 500 MG (MOD) 24 hr tablet Take 1,000 mg by mouth 2 (two) times daily  with a meal.    Yes Historical Provider, MD  omeprazole (PRILOSEC) 40 MG capsule Take 1 capsule (40 mg total) by mouth daily. 11/18/13  Yes Marisa Severin, MD  traMADol (ULTRAM) 50 MG tablet Take 1 tablet (50 mg total) by mouth every 6 (six) hours as needed for moderate pain. 11/18/13  Yes Marisa Severin, MD   BP 123/89 mmHg  Pulse 82  Temp(Src) 97.9 F (36.6 C) (Oral)  Resp 14  Ht 5\' 2"  (1.575 m)  Wt 131 lb (59.421 kg)  BMI 23.95  kg/m2  SpO2 97% Physical Exam  Constitutional: He is oriented to person, place, and time. He appears well-developed and well-nourished.  HENT:  Head: Normocephalic and atraumatic.  Eyes: EOM are normal. Pupils are equal, round, and reactive to light.  Neck: Normal range of motion. Neck supple. No JVD present.  Cardiovascular: Normal rate and regular rhythm.  Exam reveals no gallop and no friction rub.   No murmur heard. Pulmonary/Chest: No respiratory distress. He has no wheezes.  Abdominal: He exhibits no distension. There is no rebound and no guarding.  Musculoskeletal: Normal range of motion.  Neurological: He is alert and oriented to person, place, and time.  Skin: No rash noted. No pallor.  Psychiatric: He has a normal mood and affect. His behavior is normal.    ED Course  Procedures (including critical care time) Labs Review Labs Reviewed  CBC - Abnormal; Notable for the following:    HCT 37.1 (*)    All other components within normal limits  BASIC METABOLIC PANEL - Abnormal; Notable for the following:    Glucose, Bld 52 (*)    All other components within normal limits  CBG MONITORING, ED - Abnormal; Notable for the following:    Glucose-Capillary 47 (*)    All other components within normal limits  CBG MONITORING, ED - Abnormal; Notable for the following:    Glucose-Capillary 57 (*)    All other components within normal limits  CBG MONITORING, ED - Abnormal; Notable for the following:    Glucose-Capillary 190 (*)    All other components within normal limits  CBG MONITORING, ED - Abnormal; Notable for the following:    Glucose-Capillary 124 (*)    All other components within normal limits  I-STAT TROPOININ, ED  I-STAT TROPOININ, ED    Imaging Review Dg Chest 2 View  12/15/2014   CLINICAL DATA:  Pt c/o headache, dizziness, diaphoresis, bilateral blurred vision, and flashing/ blinking green lights and blinking black vision x 2 hours. Hx of hypertension , diabetes.   EXAM: CHEST  2 VIEW  COMPARISON:  11/18/2013  FINDINGS: Cardiac silhouette is normal in size and configuration. Normal mediastinal and hilar contours.  Clear lungs.  No pleural effusion or pneumothorax.  Skeletal structures are unremarkable.  IMPRESSION: No active cardiopulmonary disease.   Electronically Signed   By: Amie Portland M.D.   On: 12/15/2014 18:16   I have personally reviewed and evaluated these images and lab results as part of my medical decision-making.   EKG Interpretation   Date/Time:  Monday December 15 2014 17:10:28 EDT Ventricular Rate:  75 PR Interval:  162 QRS Duration: 81 QT Interval:  392 QTC Calculation: 438 R Axis:   2 Text Interpretation:  Sinus rhythm No significant change since last  tracing Confirmed by Adhya Cocco MD, Reuel Boom [phone]) on 12/15/2014 6:17:34 PM      MDM   Final diagnoses:  Hypoglycemia    59 yo M with a chief  complaint chest pain. Seen in triage with initial concern by nursing for retinal detachment. Patient's symptoms however bilateral associated with a feeling like his to pass out. Will  workup for chest pain with serial troponins.  . Troponins negative. Patient incidentally found to have low blood sugar. This makes when all his symptoms. Patient given oral placement not quite up to normal blood sugar given D50. Will observe for continued normoglycemia.  Patient served for an hour with no recurrence of symptoms. Requesting discharge home. We'll have him call his doctor in the morning. Return for repeat symptoms.  12:00 AM:  I have discussed the diagnosis/risks/treatment options with the patient and family and believe the pt to be eligible for discharge home to follow-up with PCP. We also discussed returning to the ED immediately if new or worsening sx occur. We discussed the sx which are most concerning (e.g., repeat episode) that necessitate immediate return. Medications administered to the patient during their visit and any new prescriptions  provided to the patient are listed below.  Medications given during this visit Medications  aspirin chewable tablet 324 mg (324 mg Oral Given 12/15/14 1708)  nitroGLYCERIN (NITROSTAT) SL tablet 0.4 mg (0.4 mg Sublingual Given 12/15/14 1724)  lidocaine (XYLOCAINE) 2 % viscous mouth solution 15 mL (15 mLs Mouth/Throat Given 12/15/14 1708)  alum & mag hydroxide-simeth (MAALOX/MYLANTA) 200-200-20 MG/5ML suspension 15 mL (15 mLs Oral Given 12/15/14 1708)  sodium chloride 0.9 % bolus 1,000 mL (0 mLs Intravenous Stopped 12/15/14 1801)  dextrose 50 % solution 50 mL (50 mLs Intravenous Given 12/15/14 2142)    New Prescriptions   No medications on file     The patient appears reasonably screen and/or stabilized for discharge and I doubt any other medical condition or other [facility] requiring further screening, evaluation, or treatment in the ED at this time prior to discharge.    Melene Plan, DO 12/16/14 0000

## 2014-12-15 NOTE — ED Notes (Addendum)
Pt c/o headache, dizziness, diaphoresis, bilateral blurred vision and flashing/blinking green lights and blinking black vision onset 2 hours ago. Pt speaks Nepali only, pt's family member has limited Cytogeneticist. Pupils reactive bilateral. Red reflex intact. Left lens mildly clouded. Pt denies eye pain.

## 2014-12-15 NOTE — ED Notes (Signed)
Bed: WA22 Expected date:  Expected time:  Means of arrival:  Comments: Triage 

## 2014-12-15 NOTE — ED Notes (Signed)
Pt given milk, coffee, apple juice & cheese.  Pt vegetarian and declines sandwich.

## 2015-02-17 ENCOUNTER — Telehealth: Payer: Self-pay

## 2015-02-17 ENCOUNTER — Ambulatory Visit: Payer: Medicaid Other | Admitting: Neurology

## 2015-02-17 NOTE — Telephone Encounter (Signed)
Patient did not come to a new patient appointment today. 

## 2015-02-18 ENCOUNTER — Encounter: Payer: Self-pay | Admitting: Neurology

## 2015-03-04 ENCOUNTER — Encounter: Payer: Self-pay | Admitting: Neurology

## 2015-03-04 ENCOUNTER — Ambulatory Visit (INDEPENDENT_AMBULATORY_CARE_PROVIDER_SITE_OTHER): Payer: Medicaid Other | Admitting: Neurology

## 2015-03-04 VITALS — BP 116/84 | HR 76 | Ht 60.0 in | Wt 122.5 lb

## 2015-03-04 DIAGNOSIS — M7502 Adhesive capsulitis of left shoulder: Secondary | ICD-10-CM

## 2015-03-04 DIAGNOSIS — M7500 Adhesive capsulitis of unspecified shoulder: Secondary | ICD-10-CM

## 2015-03-04 HISTORY — DX: Adhesive capsulitis of unspecified shoulder: M75.00

## 2015-03-04 NOTE — Patient Instructions (Signed)
   We will refer to Orthopedic surgery for a left frozen shoulder.

## 2015-03-04 NOTE — [place] Notes (Signed)
Reason for visit:  Left arm pain  Referring physician:  Dr. Tonette Ross  Levi Ross is a 59 y.o. male  History of present illness:   Mr. Levi Ross is a 59 year old right handed [place] male with a history of left arm discomfort that began about 3 months ago. The patient indicates a spontaneous onset of the problem. The patient indicates that the primary pain is in the insertion point of the deltoid muscle in the upper humerus. The patient does describe some discomfort going into the shoulder itself, and some discomfort into the forearm. He has some occasional tingling sensations into the left hand. He denies any significant neck discomfort, no relation of the left arm pain with neck or head movement. He has no problems with the legs or with the right arm. The describes no weakness of the extremities, but he does have some generalized fatigue. He denies any balance issues or difficulty controlling the bowels or the bladder. He comes in today with an interpreter. He has significant discomfort with trying to elevate the left arm. He feels better when his arm is resting. He has had MRI evaluation of the left forearm and humerus, no evaluation of the shoulder joint as been done. MRI evaluations of the arm have been unremarkable.  Past Medical History  Diagnosis Date  . Diabetes mellitus   . Hypertension   . 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 Repair of perforated ulcer  1988     in [place]- continues to have problems from it  . Upper gi endoscopy    . Colonoscopy  2014  . Cholecystectomy      Family History  Problem Relation Age of Onset  . Hypertension Mother   . Diabetes Brother   . Hypertension Sister     Social history:  reports that he has never smoked. He does not have any smokeless tobacco history on file. He reports that he does not drink alcohol or use illicit drugs.  Medications:  Prior to Admission medications     Medication Sig Start Date End Date Taking? Authorizing Provider  gabapentin (NEURONTIN) 300 MG capsule Take 300 mg by mouth daily.   Yes Historical Provider, MD  ibuprofen (ADVIL,MOTRIN) 800 MG tablet Take 800 mg by mouth every 8 (eight) hours as needed.   Yes Historical Provider, MD  lisinopril (PRINIVIL,ZESTRIL) 5 MG tablet Take 5 mg by mouth 4 (four) times daily.    Yes Historical Provider, MD  lubiprostone (AMITIZA) 24 MCG capsule Take 24 mcg by mouth 2 (two) times daily with a meal.    Yes Historical Provider, MD  metFORMIN (GLUMETZA) 500 MG (MOD) 24 hr tablet Take 500 mg by mouth 4 (four) times daily.    Yes Historical Provider, MD  omeprazole (PRILOSEC) 40 MG capsule Take 1 capsule (40 mg total) by mouth daily. 11/18/13  Yes Marisa Severinlga Otter, MD  oxyCODONE-acetaminophen (PERCOCET/ROXICET) 5-325 MG tablet Take 1 tablet by mouth 2 (two) times daily.   Yes Historical Provider, MD  simethicone (MYLICON) 125 MG chewable tablet Chew 125 mg by mouth every 6 (six) hours as needed for flatulence.   Yes Historical Provider, MD  traMADol (ULTRAM) 50 MG tablet Take 1 tablet (50 mg total) by mouth every 6 (six) hours as needed for moderate pain. Patient taking differently: Take 50 mg by mouth 2 (two) times daily.  11/18/13  Yes Marisa Severinlga Otter, MD  Vitamin D, Ergocalciferol, (DRISDOL) 50000 UNITS CAPS  capsule Take 50,000 Units by mouth every 7 (seven) days.   Yes Historical Provider, MD  gemfibrozil (LOPID) 600 MG tablet Take 600 mg by mouth at bedtime.     Historical Provider, MD     No Known Allergies  ROS:  Out of a complete 14 system review of symptoms, the patient complains only of the following symptoms, and all other reviewed systems are negative.  Weight loss Decreased vision Constipation Feeling cold Joint pain Memory loss, weakness Not enough sleep Restless legs  Blood pressure 116/84, pulse 76, height 5' (1.524 m), weight 122 lb 8 oz (55.566 kg).  Physical Exam  General: The patient is  alert and cooperative at the time of the examination.  Eyes: Pupils are equal, round, and reactive to light. Discs are flat bilaterally.  Neck: The neck is supple, no carotid bruits are noted.  Respiratory: The respiratory examination is clear.  Cardiovascular: The cardiovascular examination reveals a regular rate and rhythm, no obvious murmurs or rubs are noted.  Neuromuscular: Range of movement across the left shoulder joint is significantly restricted with rotational movements, the patient can elevate the arm only about 45. Even with passive movement across the shoulder, the arm cannot be abducted more than 50 or 60. There is significant discomfort with abduction of the arm.  Skin: Extremities are without significant edema.  Neurologic Exam  Mental status: The patient is alert and oriented x 3 at the time of the examination. The patient has apparent normal recent and remote memory, with an apparently normal attention span and concentration ability.  Cranial nerves: Facial symmetry is present. There is good sensation of the face to pinprick and soft touch bilaterally. The strength of the facial muscles and the muscles to head turning and shoulder shrug are normal bilaterally. Speech is well enunciated, no aphasia or dysarthria is noted. Extraocular movements are full. Visual fields are full. The tongue is midline, and the patient has symmetric elevation of the soft palate. No obvious hearing deficits are noted.  Motor: The motor testing reveals 5 over 5 strength of all 4 extremities. Good symmetric motor tone is noted throughout.  Sensory: Sensory testing is intact to pinprick, soft touch, vibration sensation, and position sense on all 4 extremities. No evidence of extinction is noted.  Coordination: Cerebellar testing reveals good finger-nose-finger and heel-to-shin bilaterally.  Gait and station: Gait is normal. Tandem gait is normal. Romberg is negative. No drift is  seen.  Reflexes: Deep tendon reflexes are symmetric and normal bilaterally. Toes are downgoing bilaterally.   Assessment/Plan:  1. Frozen shoulder, left  The patient is having significant discomfort around the left shoulder, there is a significant restriction of movement across the shoulder joint. The patient appears to have a frozen shoulder, and he will be referred for orthopedic evaluation. I do not believe that the current issue is primarily a neurologic one.  Marlan Palau MD 03/05/2015 3:01 PM  [facility] [address] [place], [place] [address]  Phone [phone] Fax [phone]

## 2015-03-05 ENCOUNTER — Encounter: Payer: Self-pay | Admitting: Neurology

## 2015-04-08 IMAGING — CR DG CHEST 2V
2 series · 2 of 2 positions shown · non-contrast
Comparison: 10/12/2010

CLINICAL DATA: Fever.  Abnormal breath sounds a right lung base.

EXAM:
CHEST  2 VIEW

[view not recorded (1 of 2)]
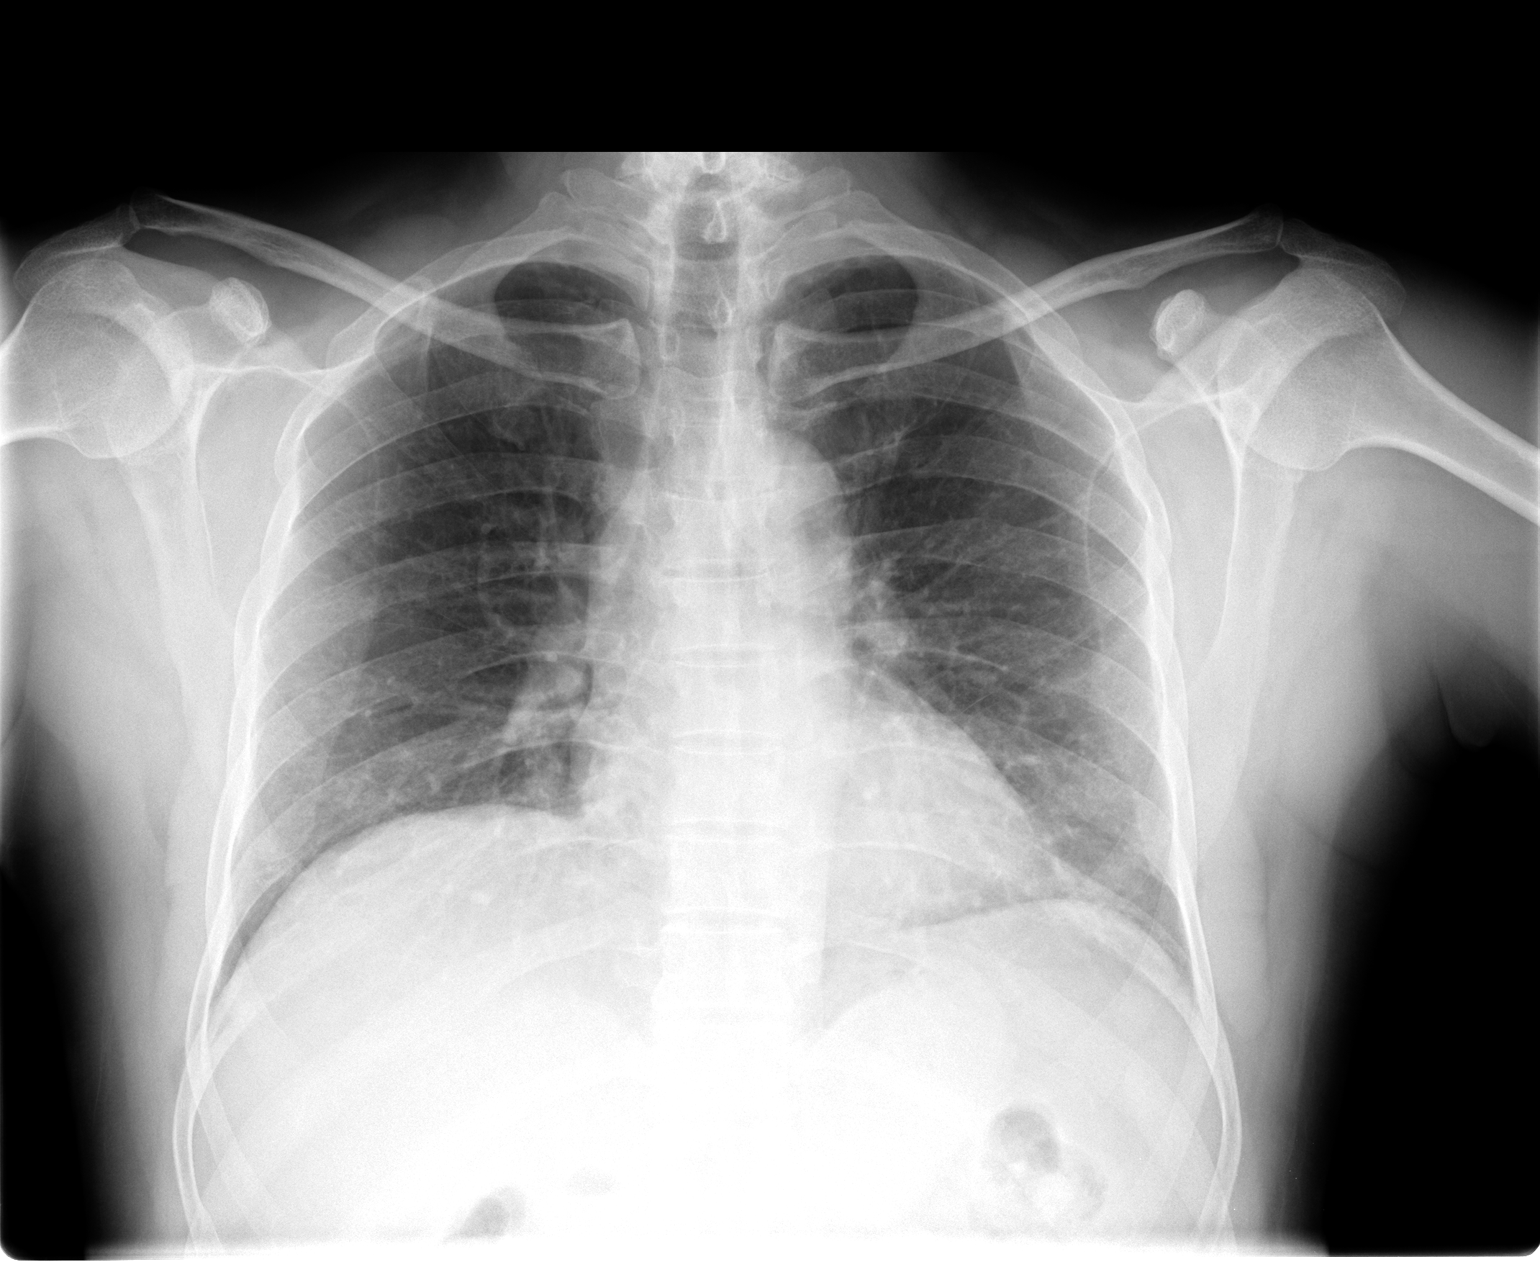

[view not recorded (2 of 2)]
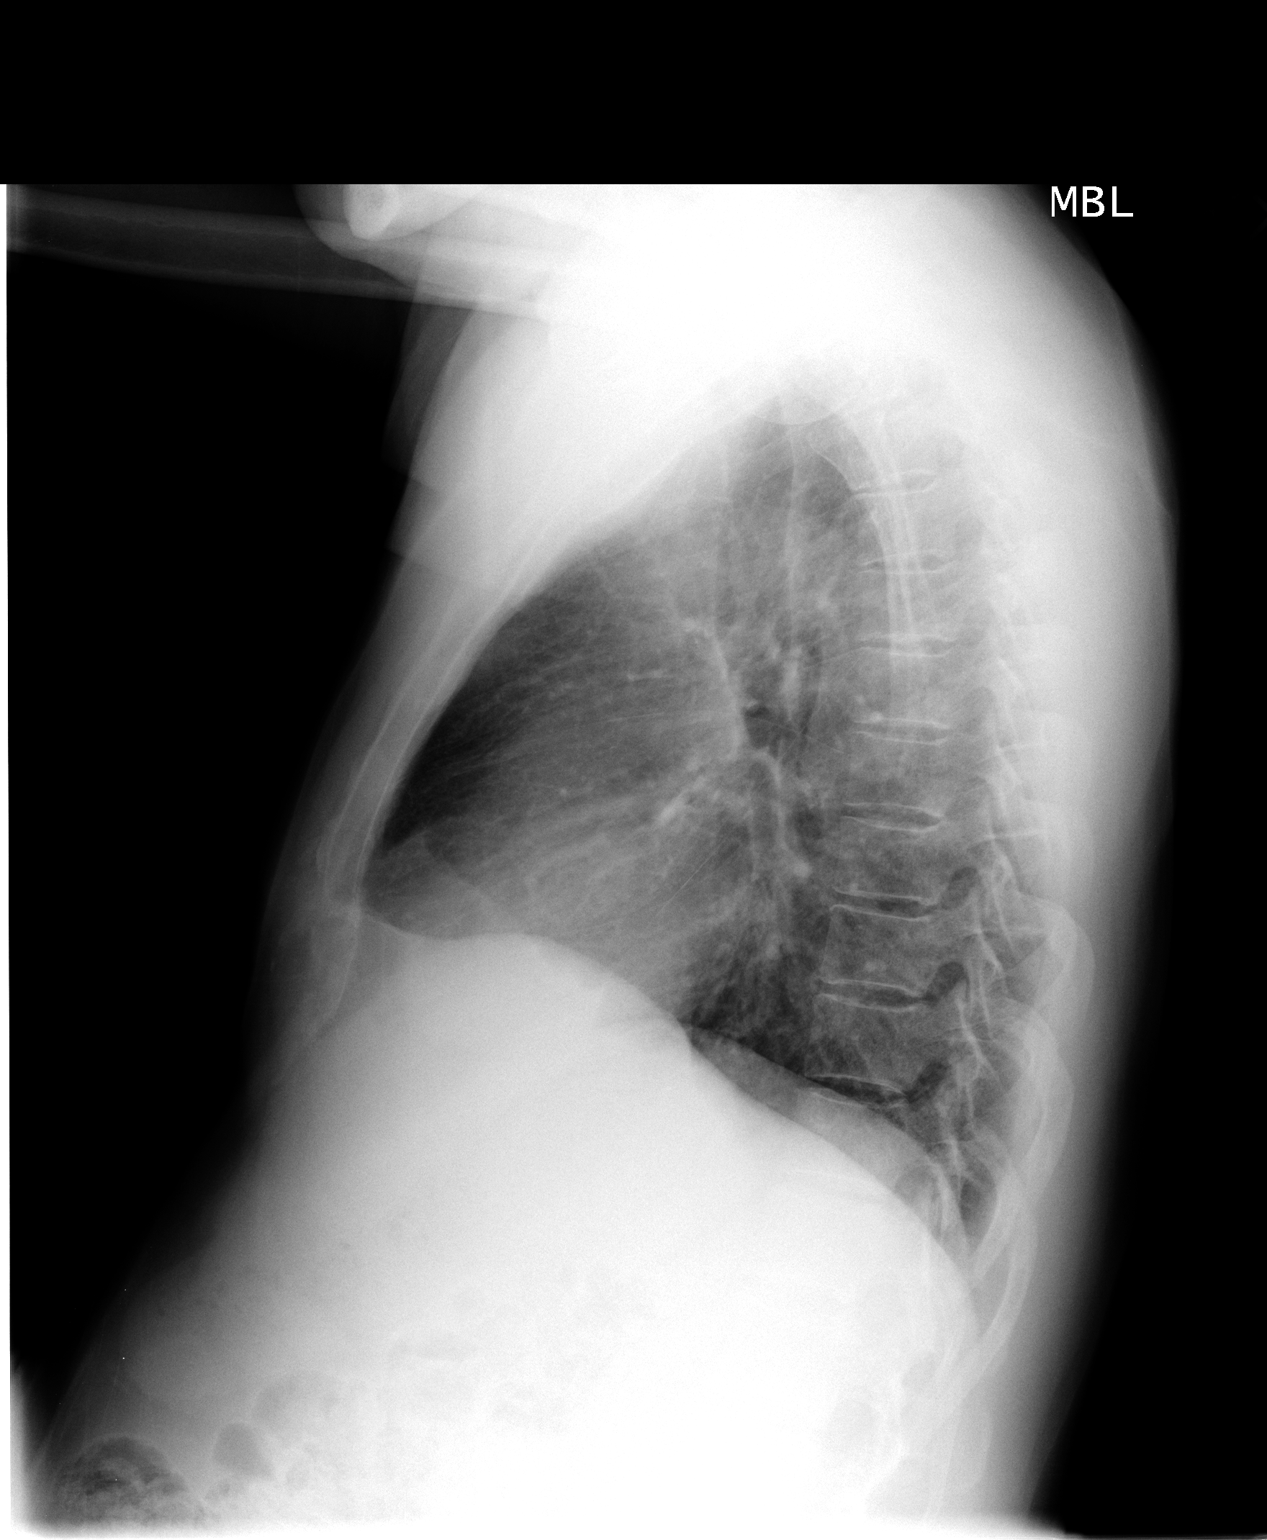

[2 of 2 positions shown; findings below may reference images not displayed]

FINDINGS: The heart size and mediastinal contours are within normal limits.
Both lungs are clear. The visualized skeletal structures are
unremarkable.
IMPRESSION: No active cardiopulmonary disease.

## 2015-04-09 ENCOUNTER — Ambulatory Visit: Payer: PRIVATE HEALTH INSURANCE | Admitting: Physical Therapy

## 2015-04-13 ENCOUNTER — Ambulatory Visit: Payer: PRIVATE HEALTH INSURANCE | Admitting: Physical Therapy

## 2015-04-14 ENCOUNTER — Ambulatory Visit: Payer: Medicaid Other | Attending: Sports Medicine

## 2015-04-14 DIAGNOSIS — M25612 Stiffness of left shoulder, not elsewhere classified: Secondary | ICD-10-CM | POA: Diagnosis not present

## 2015-04-14 DIAGNOSIS — M25512 Pain in left shoulder: Secondary | ICD-10-CM

## 2015-04-14 NOTE — Addendum Note (Signed)
Addended by: Caprice RedHASSE, Matheo Rathbone M on: 04/14/2015 03:47 PM   Modules accepted: Medications

## 2015-04-14 NOTE — Patient Instructions (Addendum)
Issued program from cabinet for ROM exercises. LT shoulder with wall and stick 10-15 reps with 5sec or more hold. 2-3x/day. Cued to get to tight place and hold and to limit pain as able but need to be at tightness. Exercise for ER/IR , hor abduction /adduction flexion, scapula mobility and encourage him to stretch any direction he feels tight.

## 2015-04-14 NOTE — Therapy (Signed)
[place], [place], [address] Phone: [phone]   Fax:  [phone]  Physical Therapy Evaluation  Patient Details  Name: Levi Ross MRN: [id number] Date of Birth: 1955/07/07 Referring Provider: Vickki Hearing, MD  Encounter Date: 04/14/2015      PT End of Session - 04/14/15 1434    Visit Number 1   Number of Visits 1   Authorization Type Medicaid   PT Start Time 0240   PT Stop Time 0335   PT Time Calculation (min) 55 min   Activity Tolerance Patient tolerated treatment well;Patient limited by pain   Behavior During Therapy [facility] for tasks assessed/performed      Past Medical History  Diagnosis Date  . Hypertension     No past surgical history on file.  There were no vitals filed for this visit.  Visit Diagnosis:  Stiffness of shoulder joint, left - Plan: PT plan of care cert/re-cert  Pain in left shoulder - Plan: PT plan of care cert/re-cert      Subjective Assessment - 04/14/15 1448    Subjective Levi Ross reports no injury to initiate pain. MD reports shoulder "frozen".  He reports he is doing exercises at home.    Patient is accompained by: Family member   Limitations Lifting  Using LT arm for selfcare and home tasks.    Diagnostic tests Xray normal   Patient Stated Goals Improve pain and motion.    Currently in Pain? Yes  Feels pain inside at bone   Pain Score 7    Pain Location Shoulder  more in upper LT arm   Pain Orientation Left   Pain Descriptors / Indicators Sharp   Pain Type Chronic pain   Pain Radiating Towards LT shoulder to elbow and forearm   Aggravating Factors  moving arm   Pain Relieving Factors medication   Multiple Pain Sites No            [facility] PT Assessment - 04/14/15 1431    Assessment   Medical Diagnosis Chronic LT shoulder pain and stiffness    Referring Provider Vickki Hearing, MD   Onset Date/Surgical Date --  3 months ago.    Hand  Dominance Right   Next MD Visit 04/20/15   Prior Therapy Injection to LT shoulder   Precautions   Precautions None   Restrictions   Weight Bearing Restrictions No   Balance Screen   Has the patient fallen in the past 6 months No   Prior Function   Level of Independence Independent   Cognition   Overall Cognitive Status Within Functional Limits for tasks assessed   ROM / Strength   AROM / PROM / Strength AROM   AROM   AROM Assessment Site Shoulder   Right/Left Shoulder Right;Left   Right Shoulder Extension 55 Degrees   Right Shoulder Flexion 157 Degrees   Right Shoulder ABduction 172 Degrees   Right Shoulder Internal Rotation 55 Degrees   Right Shoulder External Rotation 81 Degrees   Right Shoulder Horizontal ABduction 32 Degrees   Right Shoulder Horizontal  ADduction 112 Degrees   Left Shoulder Extension 25 Degrees   Left Shoulder Flexion 75 Degrees   Left Shoulder ABduction 74 Degrees   Left Shoulder Internal Rotation 0 Degrees   Left Shoulder External Rotation 15 Degrees   Left Shoulder Horizontal ABduction -30 Degrees   Left Shoulder Horizontal ADduction 75 Degrees   PROM   PROM Assessment Site Shoulder   Right/Left  Shoulder Left   Left Shoulder Extension 42 Degrees   Left Shoulder Flexion 83 Degrees   Left Shoulder ABduction 80 Degrees   Left Shoulder Internal Rotation 15 Degrees   Left Shoulder External Rotation 30 Degrees  at -20 degrees hor abuction   Left Shoulder Horizontal ABduction -28 Degrees   Left Shoulder Horizontal ADduction 92 Degrees   Palpation   Palpation comment Decreased joint mobility and decr scapula elevation and retraction and depression                           PT Education - 04/14/15 1457    Education provided Yes   Education Details POC, HEP   Person(s) Educated Patient;Child(ren)   Methods Explanation;Demonstration;Verbal cues;Handout   Comprehension Returned demonstration;Verbalized understanding                     Plan - 04/14/15 1427    Clinical Impression Statement Levi Ross presents with pain and stiffness of LT shoulder limiting use of arm for normal tasks.  He was issued some stretching and asked to ice/heat on regular basis. He declined PT after the eval as Medicaid did not pay for visits for his diagnosis    Pt will benefit from skilled therapeutic intervention in order to improve on the following deficits Impaired UE functional use;Pain;Decreased range of motion   Rehab Potential Good   PT Frequency One time visit   PT Duration --   PT Treatment/Interventions Moist Heat;Cryotherapy;Iontophoresis 85m/ml Dexamethasone;Therapeutic exercise;Taping;Dry needling;Manual techniques;Passive range of motion;Patient/family education   PT Next Visit Plan No follow up. Discharge today   PT Home Exercise Plan Shouulder stretching   Consulted and Agree with Plan of Care Patient;Family member/caregiver  ointerpreter   Family Member Consulted son         Problem List There are no active problems to display for this patient.   CDarrel HooverPT 04/14/2015, 3:41 PM  [facility] [address]Phone: [phone]  Fax:  [phone] Name: Levi PannoneMRN: [id number] of Birth: 102-01-1957PHYSICAL THERAPY DISCHARGE SUMMARY  Visits from Start of Care: Eval only  Current functional level related to goals / functional outcomes: See above   Remaining deficits: Same as at eval   Education / Equipment: HEP for stretching LT shoulder Plan: Patient agrees to discharge.  Patient goals were not met. Patient is being discharged due to financial reasons.  ?????

## 2015-06-04 ENCOUNTER — Encounter: Payer: Self-pay | Admitting: Neurology

## 2015-11-09 ENCOUNTER — Other Ambulatory Visit: Payer: Self-pay | Admitting: Sports Medicine

## 2015-11-09 DIAGNOSIS — M25512 Pain in left shoulder: Secondary | ICD-10-CM

## 2015-11-10 ENCOUNTER — Ambulatory Visit
Admission: RE | Admit: 2015-11-10 | Discharge: 2015-11-10 | Disposition: A | Discharge status: Home / Self Care | Payer: Medicaid Other | Source: Ambulatory Visit | Attending: Sports Medicine | Admitting: Sports Medicine

## 2015-11-10 DIAGNOSIS — M25512 Pain in left shoulder: Secondary | ICD-10-CM

## 2016-04-05 IMAGING — CR DG UGI W/ HIGH DENSITY W/KUB
1 series · 1 of 1 positions shown · non-contrast
Comparison: Abdominal ultrasound 05/05/2014.  CT 10/12/2010.

CLINICAL DATA: Bloating and early satiety.  Initial encounter.

EXAM:
UPPER GI SERIES WITH KUB
TECHNIQUE: After obtaining a scout radiograph a routine upper GI series was
performed using thin and high density barium.
FLUOROSCOPY TIME:  Radiation Exposure Index (as provided by the
fluoroscopic device):
If the device does not provide the exposure index:
Fluoroscopy Time (in minutes and seconds): 1 Min and and 21 seconds.
Number of Acquired Images:  28 images obtained with flow store.

[abdomen supine]
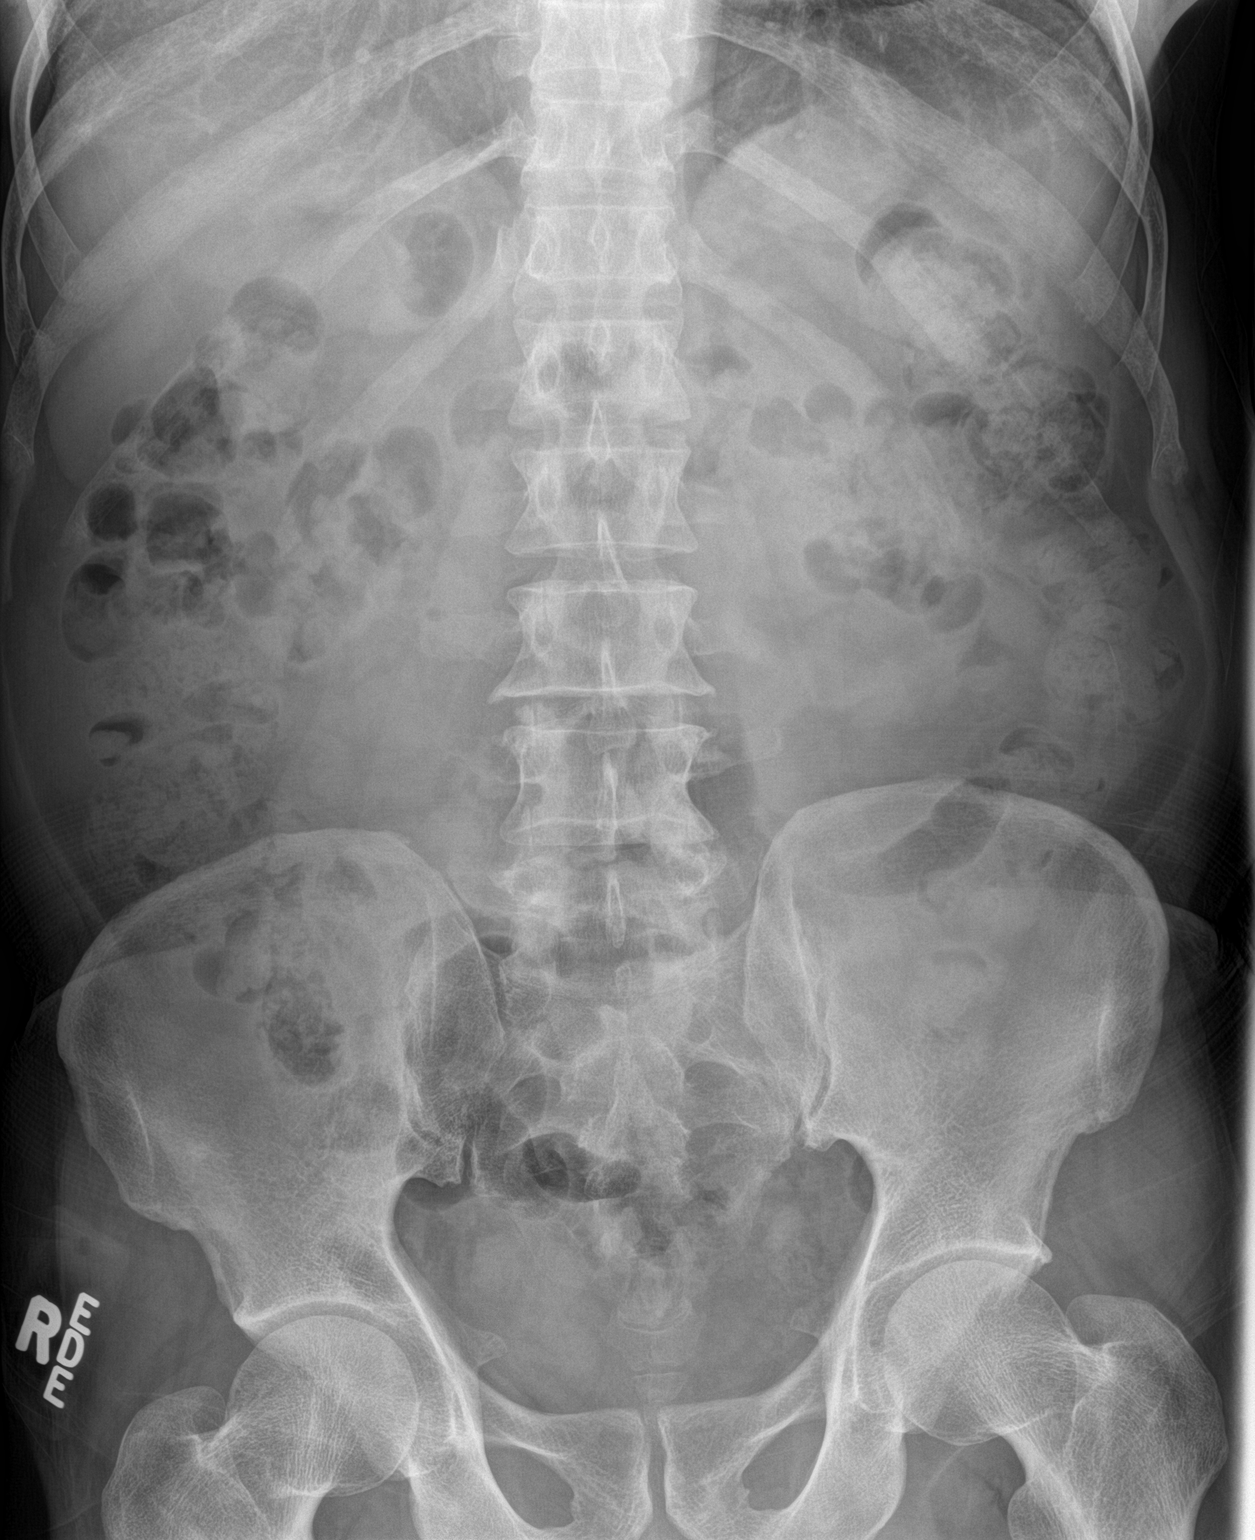

[1 of 1 positions shown; findings below may reference images not displayed]

FINDINGS: The scout abdominal radiograph demonstrates no significant findings.
The esophageal motility is within normal limits. There is no
stricture, mass, ulceration or significant hiatal hernia. Moderate
gastroesophageal reflux to the level of the left atrium was elicited
with the water siphon test.

The stomach demonstrates mild fold thickening with focal ulceration
laterally in the mid body. The distal stomach and duodenum appear
normal.

A 13 mm barium tablet was administered and passed without delay into
the stomach.
IMPRESSION: 1. Moderate gastroesophageal reflux. No evidence of esophageal
stricture or ulceration.
2. Gastric fold thickening with focal ulceration laterally in the
mid body. Endoscopic correlation recommended.
3. The distal stomach and duodenum appear normal.

## 2017-11-22 ENCOUNTER — Ambulatory Visit: Payer: Self-pay | Admitting: Physical Therapy

## 2017-11-28 ENCOUNTER — Ambulatory Visit: Payer: Medicaid Other

## 2018-01-10 ENCOUNTER — Other Ambulatory Visit: Payer: Self-pay | Admitting: Family Medicine

## 2018-02-19 ENCOUNTER — Other Ambulatory Visit: Payer: Self-pay | Admitting: Family Medicine

## 2018-03-25 ENCOUNTER — Encounter: Payer: Self-pay | Admitting: Gastroenterology

## 2021-11-18 ENCOUNTER — Emergency Department (HOSPITAL_COMMUNITY): Payer: Medicare Other

## 2021-11-18 ENCOUNTER — Emergency Department (HOSPITAL_COMMUNITY)
Admission: EM | Admit: 2021-11-18 | Discharge: 2021-11-18 | Disposition: A | Discharge status: Home / Self Care | Payer: Medicare Other | Attending: Emergency Medicine | Admitting: Emergency Medicine

## 2021-11-18 ENCOUNTER — Other Ambulatory Visit: Payer: Self-pay

## 2021-11-18 DIAGNOSIS — R079 Chest pain, unspecified: Secondary | ICD-10-CM | POA: Insufficient documentation

## 2021-11-18 DIAGNOSIS — S0990XA Unspecified injury of head, initial encounter: Secondary | ICD-10-CM | POA: Insufficient documentation

## 2021-11-18 DIAGNOSIS — M47812 Spondylosis without myelopathy or radiculopathy, cervical region: Secondary | ICD-10-CM | POA: Insufficient documentation

## 2021-11-18 DIAGNOSIS — I10 Essential (primary) hypertension: Secondary | ICD-10-CM | POA: Insufficient documentation

## 2021-11-18 DIAGNOSIS — R1032 Left lower quadrant pain: Secondary | ICD-10-CM | POA: Insufficient documentation

## 2021-11-18 DIAGNOSIS — N289 Disorder of kidney and ureter, unspecified: Secondary | ICD-10-CM | POA: Diagnosis not present

## 2021-11-18 DIAGNOSIS — S50811A Abrasion of right forearm, initial encounter: Secondary | ICD-10-CM | POA: Diagnosis not present

## 2021-11-18 DIAGNOSIS — S59911A Unspecified injury of right forearm, initial encounter: Secondary | ICD-10-CM | POA: Diagnosis present

## 2021-11-18 DIAGNOSIS — Y92410 Unspecified street and highway as the place of occurrence of the external cause: Secondary | ICD-10-CM | POA: Insufficient documentation

## 2021-11-18 DIAGNOSIS — E119 Type 2 diabetes mellitus without complications: Secondary | ICD-10-CM | POA: Diagnosis not present

## 2021-11-18 LAB — CBC WITH DIFFERENTIAL/PLATELET
Abs Immature Granulocytes: 0.1 10*3/uL — ABNORMAL HIGH (ref 0.00–0.07)
Basophils Absolute: 0 10*3/uL (ref 0.0–0.1)
Basophils Relative: 1 %
Eosinophils Absolute: 0.2 10*3/uL (ref 0.0–0.5)
Eosinophils Relative: 2 %
HCT: 38.4 % — ABNORMAL LOW (ref 39.0–52.0)
Hemoglobin: 13.1 g/dL (ref 13.0–17.0)
Immature Granulocytes: 1 %
Lymphocytes Relative: 31 %
Lymphs Abs: 2.6 10*3/uL (ref 0.7–4.0)
MCH: 30.7 pg (ref 26.0–34.0)
MCHC: 34.1 g/dL (ref 30.0–36.0)
MCV: 89.9 fL (ref 80.0–100.0)
Monocytes Absolute: 0.5 10*3/uL (ref 0.1–1.0)
Monocytes Relative: 6 %
Neutro Abs: 5.2 10*3/uL (ref 1.7–7.7)
Neutrophils Relative %: 59 %
Platelets: 226 10*3/uL (ref 150–400)
RBC: 4.27 MIL/uL (ref 4.22–5.81)
RDW: 12.4 % (ref 11.5–15.5)
WBC: 8.5 10*3/uL (ref 4.0–10.5)
nRBC: 0 % (ref 0.0–0.2)

## 2021-11-18 LAB — COMPREHENSIVE METABOLIC PANEL
ALT: 72 U/L — ABNORMAL HIGH (ref 0–44)
AST: 58 U/L — ABNORMAL HIGH (ref 15–41)
Albumin: 3.9 g/dL (ref 3.5–5.0)
Alkaline Phosphatase: 31 U/L — ABNORMAL LOW (ref 38–126)
Anion gap: 9 (ref 5–15)
BUN: 11 mg/dL (ref 8–23)
CO2: 23 mmol/L (ref 22–32)
Calcium: 9.3 mg/dL (ref 8.9–10.3)
Chloride: 107 mmol/L (ref 98–111)
Creatinine, Ser: 1.05 mg/dL (ref 0.61–1.24)
GFR, Estimated: >60 mL/min (ref >60)
Glucose, Bld: 156 mg/dL — ABNORMAL HIGH (ref 70–99)
Potassium: 3.7 mmol/L (ref 3.5–5.1)
Sodium: 139 mmol/L (ref 135–145)
Total Bilirubin: 0.6 mg/dL (ref 0.3–1.2)
Total Protein: 7.1 g/dL (ref 6.5–8.1)

## 2021-11-18 LAB — ETHANOL: Alcohol, Ethyl (B): <10 mg/dL (ref <10)

## 2021-11-18 MED ORDER — ONDANSETRON HCL 4 MG/2ML IJ SOLN
4.0000 mg | Freq: Once | INTRAMUSCULAR | Status: AC
  Administered 2021-11-18: 4 mg via INTRAVENOUS
  Filled 2021-11-18: qty 2

## 2021-11-18 MED ORDER — IOHEXOL 300 MG/ML  SOLN
80.0000 mL | Freq: Once | INTRAMUSCULAR | Status: AC | PRN
  Administered 2021-11-18: 80 mL via INTRAVENOUS

## 2021-11-18 MED ORDER — FENTANYL CITRATE PF 50 MCG/ML IJ SOSY
50.0000 ug | PREFILLED_SYRINGE | Freq: Once | INTRAMUSCULAR | Status: AC
  Administered 2021-11-18: 50 ug via INTRAVENOUS
  Filled 2021-11-18: qty 1

## 2021-11-18 MED ORDER — DICLOFENAC SODIUM 1 % EX GEL: 2.0000 g | Freq: Four times a day (QID) | CUTANEOUS | 0 refills | Status: AC

## 2021-11-18 MED ORDER — METHOCARBAMOL 500 MG PO TABS: 500.0000 mg | ORAL_TABLET | Freq: Four times a day (QID) | ORAL | 0 refills | Status: AC | PRN

## 2021-11-18 NOTE — ED Notes (Signed)
All discharge instructions including follow up care and prescriptions reviewed with patient and patient's family at bedside. Patient and family verbalized understanding of same and had no other questions. Patient stable at time of discharge and wheel chair was provided to patient prior to leaving department.
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
